# Patient Record
Sex: Female | Born: 1990 | Race: White | Hispanic: No | Smoking: Former smoker
Health system: Southern US, Community
[De-identification: ages and names within clinical notes are randomized; demographics above are authoritative.]

## PROBLEM LIST (undated history)

## (undated) ENCOUNTER — Inpatient Hospital Stay (HOSPITAL_COMMUNITY): Payer: Self-pay

## (undated) DIAGNOSIS — F419 Anxiety disorder, unspecified: Secondary | ICD-10-CM

## (undated) DIAGNOSIS — A749 Chlamydial infection, unspecified: Secondary | ICD-10-CM

## (undated) DIAGNOSIS — Z3490 Encounter for supervision of normal pregnancy, unspecified, unspecified trimester: Secondary | ICD-10-CM

## (undated) DIAGNOSIS — F32A Depression, unspecified: Secondary | ICD-10-CM

## (undated) DIAGNOSIS — O0932 Supervision of pregnancy with insufficient antenatal care, second trimester: Secondary | ICD-10-CM

## (undated) HISTORY — PX: DILATION AND CURETTAGE OF UTERUS: SHX78

## (undated) HISTORY — DX: Supervision of pregnancy with insufficient antenatal care, second trimester: O09.32

## (undated) HISTORY — PX: NO PAST SURGERIES: SHX2092

---
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
## 2009-09-17 ENCOUNTER — Emergency Department (HOSPITAL_COMMUNITY): Admission: EM | Admit: 2009-09-17 | Discharge: 2009-09-17 | Payer: Self-pay | Admitting: Emergency Medicine

## 2009-11-19 ENCOUNTER — Emergency Department (HOSPITAL_COMMUNITY): Admission: EM | Admit: 2009-11-19 | Discharge: 2009-11-19 | Payer: Self-pay | Admitting: Emergency Medicine

## 2009-12-25 ENCOUNTER — Emergency Department (HOSPITAL_COMMUNITY): Admission: EM | Admit: 2009-12-25 | Discharge: 2009-12-26 | Payer: Self-pay | Admitting: Emergency Medicine

## 2011-03-25 ENCOUNTER — Emergency Department (HOSPITAL_COMMUNITY): Payer: No Typology Code available for payment source

## 2011-03-25 ENCOUNTER — Emergency Department (HOSPITAL_COMMUNITY)
Admission: EM | Admit: 2011-03-25 | Discharge: 2011-03-25 | Disposition: A | Discharge status: Home / Self Care | Payer: No Typology Code available for payment source | Attending: Emergency Medicine | Admitting: Emergency Medicine

## 2011-03-25 DIAGNOSIS — R071 Chest pain on breathing: Secondary | ICD-10-CM | POA: Insufficient documentation

## 2011-03-25 DIAGNOSIS — S40029A Contusion of unspecified upper arm, initial encounter: Secondary | ICD-10-CM | POA: Insufficient documentation

## 2011-03-25 DIAGNOSIS — IMO0002 Reserved for concepts with insufficient information to code with codable children: Secondary | ICD-10-CM | POA: Insufficient documentation

## 2011-07-31 ENCOUNTER — Emergency Department (HOSPITAL_COMMUNITY): Payer: Self-pay

## 2011-07-31 ENCOUNTER — Emergency Department (HOSPITAL_COMMUNITY)
Admission: EM | Admit: 2011-07-31 | Discharge: 2011-08-01 | Disposition: A | Discharge status: Home / Self Care | Payer: Self-pay | Attending: Emergency Medicine | Admitting: Emergency Medicine

## 2011-07-31 DIAGNOSIS — R05 Cough: Secondary | ICD-10-CM | POA: Insufficient documentation

## 2011-07-31 DIAGNOSIS — J40 Bronchitis, not specified as acute or chronic: Secondary | ICD-10-CM | POA: Insufficient documentation

## 2011-07-31 DIAGNOSIS — R062 Wheezing: Secondary | ICD-10-CM | POA: Insufficient documentation

## 2011-07-31 DIAGNOSIS — R059 Cough, unspecified: Secondary | ICD-10-CM | POA: Insufficient documentation

## 2011-07-31 LAB — URINALYSIS, ROUTINE W REFLEX MICROSCOPIC
Bilirubin Urine: NEGATIVE
Glucose, UA: NEGATIVE mg/dL
Hgb urine dipstick: NEGATIVE
Ketones, ur: NEGATIVE mg/dL
Leukocytes, UA: NEGATIVE
Nitrite: NEGATIVE
Protein, ur: NEGATIVE mg/dL
Specific Gravity, Urine: 1.026 (ref 1.005–1.030)
Urobilinogen, UA: 1 mg/dL (ref 0.0–1.0)
pH: 7 (ref 5.0–8.0)

## 2011-07-31 LAB — POCT PREGNANCY, URINE: Preg Test, Ur: NEGATIVE

## 2011-08-21 ENCOUNTER — Emergency Department (HOSPITAL_COMMUNITY): Payer: Self-pay

## 2011-08-21 ENCOUNTER — Emergency Department (HOSPITAL_COMMUNITY)
Admission: EM | Admit: 2011-08-21 | Discharge: 2011-08-21 | Disposition: A | Discharge status: Home / Self Care | Payer: Self-pay | Attending: Emergency Medicine | Admitting: Emergency Medicine

## 2011-08-21 DIAGNOSIS — M79609 Pain in unspecified limb: Secondary | ICD-10-CM | POA: Insufficient documentation

## 2011-08-21 DIAGNOSIS — IMO0002 Reserved for concepts with insufficient information to code with codable children: Secondary | ICD-10-CM | POA: Insufficient documentation

## 2011-08-21 DIAGNOSIS — M7989 Other specified soft tissue disorders: Secondary | ICD-10-CM | POA: Insufficient documentation

## 2011-08-21 DIAGNOSIS — Y92009 Unspecified place in unspecified non-institutional (private) residence as the place of occurrence of the external cause: Secondary | ICD-10-CM | POA: Insufficient documentation

## 2011-08-21 DIAGNOSIS — S63639A Sprain of interphalangeal joint of unspecified finger, initial encounter: Secondary | ICD-10-CM | POA: Insufficient documentation

## 2011-08-21 DIAGNOSIS — X500XXA Overexertion from strenuous movement or load, initial encounter: Secondary | ICD-10-CM | POA: Insufficient documentation

## 2011-10-28 NOTE — L&D Delivery Note (Signed)
Delivery Note At 7:36 AM a viable female was delivered via Vaginal, Spontaneous Delivery (Presentation: Right Occiput Anterior).  APGAR: 8, 9; weight 6 lb 10.7 oz (3025 g).   Placenta status: Intact, Spontaneous.  Cord: 3 vessels with the following complications: None.    Anesthesia: Local  Episiotomy: None Lacerations: Labial Suture Repair: 3.0 vicryl rapide Est. Blood Loss (mL): 300  Mom to postpartum.  Baby to stay with Mommy.  BOVARD,Katessa Attridge 08/17/2012, 7:59 AM  A+/Bo/ Contra?/

## 2012-01-01 ENCOUNTER — Inpatient Hospital Stay (HOSPITAL_COMMUNITY): Payer: Medicaid Other

## 2012-01-01 ENCOUNTER — Encounter (HOSPITAL_COMMUNITY): Payer: Self-pay | Admitting: *Deleted

## 2012-01-01 ENCOUNTER — Inpatient Hospital Stay (HOSPITAL_COMMUNITY)
Admission: AD | Admit: 2012-01-01 | Discharge: 2012-01-01 | Disposition: A | Discharge status: Home / Self Care | Payer: Medicaid Other | Source: Ambulatory Visit | Attending: Obstetrics & Gynecology | Admitting: Obstetrics & Gynecology

## 2012-01-01 DIAGNOSIS — O210 Mild hyperemesis gravidarum: Secondary | ICD-10-CM | POA: Insufficient documentation

## 2012-01-01 DIAGNOSIS — R109 Unspecified abdominal pain: Secondary | ICD-10-CM | POA: Insufficient documentation

## 2012-01-01 DIAGNOSIS — O219 Vomiting of pregnancy, unspecified: Secondary | ICD-10-CM

## 2012-01-01 DIAGNOSIS — Z3400 Encounter for supervision of normal first pregnancy, unspecified trimester: Secondary | ICD-10-CM

## 2012-01-01 HISTORY — DX: Chlamydial infection, unspecified: A74.9

## 2012-01-01 LAB — URINALYSIS, ROUTINE W REFLEX MICROSCOPIC
Bilirubin Urine: NEGATIVE
Ketones, ur: 15 mg/dL — AB
Nitrite: NEGATIVE
Urobilinogen, UA: 0.2 mg/dL (ref 0.0–1.0)

## 2012-01-01 LAB — CBC
MCHC: 33.9 g/dL (ref 30.0–36.0)
MCV: 96.8 fL (ref 78.0–100.0)
Platelets: 244 10*3/uL (ref 150–400)
RDW: 12.9 % (ref 11.5–15.5)
WBC: 7.3 10*3/uL (ref 4.0–10.5)

## 2012-01-01 LAB — WET PREP, GENITAL
Clue Cells Wet Prep HPF POC: NONE SEEN
Trich, Wet Prep: NONE SEEN

## 2012-01-01 MED ORDER — PRENATAL RX 60-1 MG PO TABS: 1.0000 | ORAL_TABLET | Freq: Every day | ORAL | Status: DC

## 2012-01-01 MED ORDER — ONDANSETRON 4 MG PO TBDP: 4.0000 mg | ORAL_TABLET | Freq: Four times a day (QID) | ORAL | Status: AC | PRN

## 2012-01-01 NOTE — ED Provider Notes (Signed)
Jordan Fowler y.o.G2P0010 @[redacted]w[redacted]d  Chief Complaint  Patient presents with  . Possible Pregnancy    SUBJECTIVE  HPI: Pt presents to MAU with n/v for 1-2 weeks and abdominal cramping.  Patient's last menstrual period was 11/20/2011.  She denies LOF, vaginal bleeding, vaginal itching/burning, urinary symptoms, fever/chills, dizziness, or h/a.    Past Medical History  Diagnosis Date  . Chlamydia 2008   Past Surgical History  Procedure Date  . No past surgeries    History   Social History  . Marital Status: Single    Spouse Name: N/A    Number of Children: N/A  . Years of Education: N/A   Occupational History  . Not on file.   Social History Main Topics  . Smoking status: Former Smoker    Quit date: 12/21/2011  . Smokeless tobacco: Not on file  . Alcohol Use: No  . Drug Use: No  . Sexually Active: Yes    Birth Control/ Protection: None   Other Topics Concern  . Not on file   Social History Narrative  . No narrative on file   No current facility-administered medications on file prior to encounter.   No current outpatient prescriptions on file prior to encounter.   No Known Allergies  ROS: Pertinent items in HPI  OBJECTIVE Blood pressure 108/50, pulse 78, temperature 98 F (36.7 C), temperature source Oral, resp. rate 16, height 5\' 4"  (1.626 m), weight 64.683 kg (142 lb 9.6 oz), last menstrual period 11/20/2011, SpO2 100.00%. GENERAL: Well-developed, well-nourished female in no acute distress.  LUNGS: Clear to auscultation bilaterally.  HEART: Regular rate and rhythm. ABDOMEN: Soft, nontender EXTREMITIES: Nontender, no edema Pelvic exam: Cervix pink, visually closed, scant clear discharge Bimanual exam: Cervix 0/long/high, soft, anterior, neg CMT, uterus tender upon palpation, slightly enlarged, adnexa without enlargement or mass    RESULTS Results for orders placed during the hospital encounter of 01/01/12 (from the past 24 hour(s))  URINALYSIS, ROUTINE W  REFLEX MICROSCOPIC     Status: Abnormal   Collection Time   01/01/12  2:15 PM      Component Value Range   Color, Urine YELLOW  YELLOW    APPearance HAZY (*) CLEAR    Specific Gravity, Urine 1.025  1.005 - 1.030    pH 6.0  5.0 - 8.0    Glucose, UA NEGATIVE  NEGATIVE (mg/dL)   Hgb urine dipstick NEGATIVE  NEGATIVE    Bilirubin Urine NEGATIVE  NEGATIVE    Ketones, ur 15 (*) NEGATIVE (mg/dL)   Protein, ur NEGATIVE  NEGATIVE (mg/dL)   Urobilinogen, UA 0.2  0.0 - 1.0 (mg/dL)   Nitrite NEGATIVE  NEGATIVE    Leukocytes, UA NEGATIVE  NEGATIVE   PREGNANCY, URINE     Status: Abnormal   Collection Time   01/01/12  2:15 PM      Component Value Range   Preg Test, Ur POSITIVE (*) NEGATIVE   POCT PREGNANCY, URINE     Status: Abnormal   Collection Time   01/01/12  2:22 PM      Component Value Range   Preg Test, Ur POSITIVE (*) NEGATIVE   HCG, QUANTITATIVE, PREGNANCY     Status: Abnormal   Collection Time   01/01/12  3:29 PM      Component Value Range   hCG, Beta Chain, Quant, S 78295 (*) <5 (mIU/mL)  CBC     Status: Abnormal   Collection Time   01/01/12  3:29 PM      Component Value  Range   WBC 7.3  4.0 - 10.5 (K/uL)   RBC 3.41 (*) 3.87 - 5.11 (MIL/uL)   Hemoglobin 11.2 (*) 12.0 - 15.0 (g/dL)   HCT 69.6 (*) 29.5 - 46.0 (%)   MCV 96.8  78.0 - 100.0 (fL)   MCH 32.8  26.0 - 34.0 (pg)   MCHC 33.9  30.0 - 36.0 (g/dL)   RDW 28.4  13.2 - 44.0 (%)   Platelets 244  150 - 400 (K/uL)  WET PREP, GENITAL     Status: Abnormal   Collection Time   01/01/12  5:30 PM      Component Value Range   Yeast Wet Prep HPF POC NONE SEEN  NONE SEEN    Trich, Wet Prep NONE SEEN  NONE SEEN    Clue Cells Wet Prep HPF POC NONE SEEN  NONE SEEN    WBC, Wet Prep HPF POC FEW (*) NONE SEEN     IMAGING   ASSESSMENT IUP [redacted]w[redacted]d by U/S N/V of pregnancy  PLAN D/C home Prenatal vitamins Zofran 4mg  PO Q6 hours PRN nausea List of prenatal providers given F/U with prenatal care by 12 weeks Return to MAU as  needed

## 2012-01-01 NOTE — Progress Notes (Signed)
Patient states she has been having lower back pain for 3 days, has had nausea with some vomiting for 1 1/2 weeks. No bleeding. Has missed her last period.

## 2012-01-01 NOTE — Discharge Instructions (Signed)
Pregnancy - First Trimester  During sexual intercourse, millions of sperm go into the vagina. Only 1 sperm will penetrate and fertilize the female egg while it is in the Fallopian tube. One week later, the fertilized egg implants into the wall of the uterus. An embryo begins to develop into a baby. At 6 to 8 weeks, the eyes and face are formed and the heartbeat can be seen on ultrasound. At the end of 12 weeks (first trimester), all the baby's organs are formed. Now that you are pregnant, you will want to do everything you can to have a healthy baby. Two of the most important things are to get good prenatal care and follow your caregiver's instructions. Prenatal care is all the medical care you receive before the baby's birth. It is given to prevent, find, and treat problems during the pregnancy and childbirth.  PRENATAL EXAMS   During prenatal visits, your weight, blood pressure and urine are checked. This is done to make sure you are healthy and progressing normally during the pregnancy.   A pregnant woman should gain 25 to 35 pounds during the pregnancy. However, if you are over weight or underweight, your caregiver will advise you regarding your weight.   Your caregiver will ask and answer questions for you.   Blood work, cervical cultures, other necessary tests and a Pap test are done during your prenatal exams. These tests are done to check on your health and the probable health of your baby. Tests are strongly recommended and done for HIV with your permission. This is the virus that causes AIDS. These tests are done because medications can be given to help prevent your baby from being born with this infection should you have been infected without knowing it. Blood work is also used to find out your blood type, previous infections and follow your blood levels (hemoglobin).   Low hemoglobin (anemia) is common during pregnancy. Iron and vitamins are given to help prevent this. Later in the pregnancy, blood  tests for diabetes will be done along with any other tests if any problems develop. You may need tests to make sure you and the baby are doing well.   You may need other tests to make sure you and the baby are doing well.  CHANGES DURING THE FIRST TRIMESTER (THE FIRST 3 MONTHS OF PREGNANCY)  Your body goes through many changes during pregnancy. They vary from person to person. Talk to your caregiver about changes you notice and are concerned about. Changes can include:   Your menstrual period stops.   The egg and sperm carry the genes that determine what you look like. Genes from you and your partner are forming a baby. The female genes determine whether the baby is a boy or a girl.   Your body increases in girth and you may feel bloated.   Feeling sick to your stomach (nauseous) and throwing up (vomiting). If the vomiting is uncontrollable, call your caregiver.   Your breasts will begin to enlarge and become tender.   Your nipples may stick out more and become darker.   The need to urinate more. Painful urination may mean you have a bladder infection.   Tiring easily.   Loss of appetite.   Cravings for certain kinds of food.   At first, you may gain or lose a couple of pounds.   You may have changes in your emotions from day to day (excited to be pregnant or concerned something may go wrong with   the pregnancy and baby).   You may have more vivid and strange dreams.  HOME CARE INSTRUCTIONS    It is very important to avoid all smoking, alcohol and un-prescribed drugs during your pregnancy. These affect the formation and growth of the baby. Avoid chemicals while pregnant to ensure the delivery of a healthy infant.   Start your prenatal visits by the 12th week of pregnancy. They are usually scheduled monthly at first, then more often in the last 2 months before delivery. Keep your caregiver's appointments. Follow your caregiver's instructions regarding medication use, blood and lab tests, exercise, and  diet.   During pregnancy, you are providing food for you and your baby. Eat regular, well-balanced meals. Choose foods such as meat, fish, milk and other low fat dairy products, vegetables, fruits, and whole-grain breads and cereals. Your caregiver will tell you of the ideal weight gain.   You can help morning sickness by keeping soda crackers at the bedside. Eat a couple before arising in the morning. You may want to use the crackers without salt on them.   Eating 4 to 5 small meals rather than 3 large meals a day also may help the nausea and vomiting.   Drinking liquids between meals instead of during meals also seems to help nausea and vomiting.   A physical sexual relationship may be continued throughout pregnancy if there are no other problems. Problems may be early (premature) leaking of amniotic fluid from the membranes, vaginal bleeding, or belly (abdominal) pain.   Exercise regularly if there are no restrictions. Check with your caregiver or physical therapist if you are unsure of the safety of some of your exercises. Greater weight gain will occur in the last 2 trimesters of pregnancy. Exercising will help:   Control your weight.   Keep you in shape.   Prepare you for labor and delivery.   Help you lose your pregnancy weight after you deliver your baby.   Wear a good support or jogging bra for breast tenderness during pregnancy. This may help if worn during sleep too.   Ask when prenatal classes are available. Begin classes when they are offered.   Do not use hot tubs, steam rooms or saunas.   Wear your seat belt when driving. This protects you and your baby if you are in an accident.   Avoid raw meat, uncooked cheese, cat litter boxes and soil used by cats throughout the pregnancy. These carry germs that can cause birth defects in the baby.   The first trimester is a good time to visit your dentist for your dental health. Getting your teeth cleaned is OK. Use a softer toothbrush and brush  gently during pregnancy.   Ask for help if you have financial, counseling or nutritional needs during pregnancy. Your caregiver will be able to offer counseling for these needs as well as refer you for other special needs.   Do not take any medications or herbs unless told by your caregiver.   Inform your caregiver if there is any mental or physical domestic violence.   Make a list of emergency phone numbers of family, friends, hospital, and police and fire departments.   Write down your questions. Take them to your prenatal visit.   Do not douche.   Do not cross your legs.   If you have to stand for long periods of time, rotate you feet or take small steps in a circle.   You may have more vaginal secretions that may   require a sanitary pad. Do not use tampons or scented sanitary pads.  MEDICATIONS AND DRUG USE IN PREGNANCY   Take prenatal vitamins as directed. The vitamin should contain 1 milligram of folic acid. Keep all vitamins out of reach of children. Only a couple vitamins or tablets containing iron may be fatal to a baby or young child when ingested.   Avoid use of all medications, including herbs, over-the-counter medications, not prescribed or suggested by your caregiver. Only take over-the-counter or prescription medicines for pain, discomfort, or fever as directed by your caregiver. Do not use aspirin, ibuprofen, or naproxen unless directed by your caregiver.   Let your caregiver also know about herbs you may be using.   Alcohol is related to a number of birth defects. This includes fetal alcohol syndrome. All alcohol, in any form, should be avoided completely. Smoking will cause low birth rate and premature babies.   Street or illegal drugs are very harmful to the baby. They are absolutely forbidden. A baby born to an addicted mother will be addicted at birth. The baby will go through the same withdrawal an adult does.   Let your caregiver know about any medications that you have to take  and for what reason you take them.  MISCARRIAGE IS COMMON DURING PREGNANCY  A miscarriage does not mean you did something wrong. It is not a reason to worry about getting pregnant again. Your caregiver will help you with questions you may have. If you have a miscarriage, you may need minor surgery.  SEEK MEDICAL CARE IF:   You have any concerns or worries during your pregnancy. It is better to call with your questions if you feel they cannot wait, rather than worry about them.  SEEK IMMEDIATE MEDICAL CARE IF:    An unexplained oral temperature above 102 F (38.9 C) develops, or as your caregiver suggests.   You have leaking of fluid from the vagina (birth canal). If leaking membranes are suspected, take your temperature and inform your caregiver of this when you call.   There is vaginal spotting or bleeding. Notify your caregiver of the amount and how many pads are used.   You develop a bad smelling vaginal discharge with a change in the color.   You continue to feel sick to your stomach (nauseated) and have no relief from remedies suggested. You vomit blood or coffee ground-like materials.   You lose more than 2 pounds of weight in 1 week.   You gain more than 2 pounds of weight in 1 week and you notice swelling of your face, hands, feet, or legs.   You gain 5 pounds or more in 1 week (even if you do not have swelling of your hands, face, legs, or feet).   You get exposed to German measles and have never had them.   You are exposed to fifth disease or chickenpox.   You develop belly (abdominal) pain. Round ligament discomfort is a common non-cancerous (benign) cause of abdominal pain in pregnancy. Your caregiver still must evaluate this.   You develop headache, fever, diarrhea, pain with urination, or shortness of breath.   You fall or are in a car accident or have any kind of trauma.   There is mental or physical violence in your home.  Document Released: 10/07/2001 Document Revised: 10/02/2011  Document Reviewed: 04/10/2009  [employer] Patient Information 2012 [employer].

## 2012-01-02 LAB — GC/CHLAMYDIA PROBE AMP, GENITAL: Chlamydia, DNA Probe: NEGATIVE

## 2012-04-29 ENCOUNTER — Encounter (HOSPITAL_COMMUNITY): Payer: Self-pay | Admitting: *Deleted

## 2012-04-29 ENCOUNTER — Inpatient Hospital Stay (HOSPITAL_COMMUNITY)
Admission: AD | Admit: 2012-04-29 | Discharge: 2012-04-29 | Disposition: A | Discharge status: Home / Self Care | Payer: Medicaid Other | Source: Ambulatory Visit | Attending: Obstetrics and Gynecology | Admitting: Obstetrics and Gynecology

## 2012-04-29 DIAGNOSIS — R109 Unspecified abdominal pain: Secondary | ICD-10-CM | POA: Insufficient documentation

## 2012-04-29 DIAGNOSIS — N390 Urinary tract infection, site not specified: Secondary | ICD-10-CM | POA: Insufficient documentation

## 2012-04-29 DIAGNOSIS — O2340 Unspecified infection of urinary tract in pregnancy, unspecified trimester: Secondary | ICD-10-CM

## 2012-04-29 DIAGNOSIS — O2390 Unspecified genitourinary tract infection in pregnancy, unspecified trimester: Secondary | ICD-10-CM | POA: Insufficient documentation

## 2012-04-29 LAB — URINALYSIS, ROUTINE W REFLEX MICROSCOPIC
Bilirubin Urine: NEGATIVE
Nitrite: POSITIVE — AB
Protein, ur: 30 mg/dL — AB
Specific Gravity, Urine: 1.025 (ref 1.005–1.030)
Urobilinogen, UA: 0.2 mg/dL (ref 0.0–1.0)

## 2012-04-29 LAB — URINE MICROSCOPIC-ADD ON

## 2012-04-29 MED ORDER — PHENAZOPYRIDINE HCL 200 MG PO TABS: 200.0000 mg | ORAL_TABLET | Freq: Three times a day (TID) | ORAL | Status: AC

## 2012-04-29 MED ORDER — PHENAZOPYRIDINE HCL 100 MG PO TABS
200.0000 mg | ORAL_TABLET | Freq: Once | ORAL | Status: AC
  Administered 2012-04-29: 200 mg via ORAL
  Filled 2012-04-29: qty 2

## 2012-04-29 MED ORDER — NITROFURANTOIN MONOHYD MACRO 100 MG PO CAPS
100.0000 mg | ORAL_CAPSULE | Freq: Once | ORAL | Status: AC
  Administered 2012-04-29: 100 mg via ORAL
  Filled 2012-04-29: qty 1

## 2012-04-29 MED ORDER — NITROFURANTOIN MONOHYD MACRO 100 MG PO CAPS: 100.0000 mg | ORAL_CAPSULE | Freq: Two times a day (BID) | ORAL | Status: AC

## 2012-04-29 NOTE — MAU Provider Note (Signed)
History     CSN: [id number]  Arrival date and time: 04/29/12 0343   First Provider Initiated Contact with Patient 04/29/12 [id number]      Chief Complaint  Patient presents with  . Abdominal Pain   HPI Jordan Fowler 21 y.o. [redacted]w[redacted]d  Comes to MAU with lower abdominal pain and difficulty urinating since 8 pm on 04-28-12.  Having urgency and frequency and urinating in very small amounts.  Having some back tenderness on the right side.  Vomited x one this evening.  Has not had vomiting during this pregnancy.  Next appointment in the office is on Friday.  OB History    Grav Para Term Preterm Abortions TAB SAB Ect Mult Living   2 0   1  1   0      Past Medical History  Diagnosis Date  . Chlamydia 2008    Past Surgical History  Procedure Date  . No past surgeries     History reviewed. No pertinent family history.  History  Substance Use Topics  . Smoking status: Former Smoker    Quit date: 12/21/2011  . Smokeless tobacco: Not on file  . Alcohol Use: No    Allergies: No Known Allergies  Prescriptions prior to admission  Medication Sig Dispense Refill  . Prenatal Vit-Fe Fumarate-FA (PRENATAL MULTIVITAMIN) 60-1 MG tablet Take 1 tablet by mouth daily.  30 tablet  5  . acetaminophen (TYLENOL) 500 MG tablet Take 500 mg by mouth every 6 (six) hours as needed. pain      . albuterol (PROVENTIL HFA;VENTOLIN HFA) 108 (90 BASE) MCG/ACT inhaler Inhale 2 puffs into the lungs every 6 (six) hours as needed. SOB        Review of Systems  Constitutional: Negative for fever.  Gastrointestinal: Positive for abdominal pain. Negative for nausea and vomiting.  Genitourinary: Positive for dysuria, urgency and frequency.   Physical Exam   Blood pressure 121/67, pulse 89, temperature 99.1 F (37.3 C), temperature source Oral, resp. rate 20, height 5' 4.25" (1.632 m), weight 152 lb (68.947 kg), last menstrual period 11/20/2011.  Physical Exam  Nursing note and vitals reviewed. Constitutional: She  is oriented to person, place, and time. She appears well-developed and well-nourished.  HENT:  Head: Normocephalic.  Eyes: EOM are normal.  Neck: Neck supple.  GI: Soft. There is tenderness. There is no rebound and no guarding.       RLQ pain with palpation  No contractions palpated.  No contractions on monitor strip.  FHT baseline 150.  Genitourinary:       No CVA tenderness  Musculoskeletal: Normal range of motion.  Neurological: She is alert and oriented to person, place, and time.  Skin: Skin is warm and dry.  Psychiatric: She has a normal mood and affect.    MAU Course  Procedures Results for orders placed during the hospital encounter of 04/29/12 (from the past 24 hour(s))  URINALYSIS, ROUTINE W REFLEX MICROSCOPIC     Status: Abnormal   Collection Time   04/29/12  3:45 AM      Component Value Range   Color, Urine YELLOW  YELLOW   APPearance CLEAR  CLEAR   Specific Gravity, Urine 1.025  1.005 - 1.030   pH 6.0  5.0 - 8.0   Glucose, UA NEGATIVE  NEGATIVE mg/dL   Hgb urine dipstick SMALL (*) NEGATIVE   Bilirubin Urine NEGATIVE  NEGATIVE   Ketones, ur NEGATIVE  NEGATIVE mg/dL   Protein, ur 30 (*) NEGATIVE mg/dL  Urobilinogen, UA 0.2  0.0 - 1.0 mg/dL   Nitrite POSITIVE (*) NEGATIVE   Leukocytes, UA MODERATE (*) NEGATIVE  URINE MICROSCOPIC-ADD ON     Status: Normal   Collection Time   04/29/12  3:45 AM      Component Value Range   Squamous Epithelial / LPF RARE  RARE   WBC, UA 0-2  <3 WBC/hpf   RBC / HPF 0-2  <3 RBC/hpf   MDM Urine culture pending.  Symptoms classic for UTI.  Given pyridium and Macrobid in MAU.  Beginning to feel better within 20 minutes.  Assessment and Plan  UTI in pregnancy  Plan Will prescribe macrobid 100 mg po bid x 7 days and pyridium 200 mg po tid x 2 days. Keep your appointment in the office on Friday.   Get your prescriptions filled on the way home. Drink at least 8 8-oz glasses of water every day.   Tieler Cournoyer 04/29/2012, 4:18 AM

## 2012-04-29 NOTE — MAU Note (Signed)
Right sided abdominal pain. No vaginal bleeding or discharge. Having trouble urinating.

## 2012-04-30 ENCOUNTER — Other Ambulatory Visit (HOSPITAL_COMMUNITY): Payer: Self-pay | Admitting: Obstetrics and Gynecology

## 2012-04-30 ENCOUNTER — Other Ambulatory Visit (HOSPITAL_COMMUNITY): Payer: Self-pay | Admitting: *Deleted

## 2012-04-30 DIAGNOSIS — Z3689 Encounter for other specified antenatal screening: Secondary | ICD-10-CM

## 2012-05-01 LAB — URINE CULTURE

## 2012-05-04 ENCOUNTER — Ambulatory Visit (HOSPITAL_COMMUNITY)
Admission: RE | Admit: 2012-05-04 | Discharge: 2012-05-04 | Disposition: A | Discharge status: Home / Self Care | Payer: Medicaid Other | Source: Ambulatory Visit | Attending: Obstetrics and Gynecology | Admitting: Obstetrics and Gynecology

## 2012-05-04 DIAGNOSIS — Z3689 Encounter for other specified antenatal screening: Secondary | ICD-10-CM

## 2012-05-04 DIAGNOSIS — O09299 Supervision of pregnancy with other poor reproductive or obstetric history, unspecified trimester: Secondary | ICD-10-CM | POA: Insufficient documentation

## 2012-05-04 DIAGNOSIS — O358XX0 Maternal care for other (suspected) fetal abnormality and damage, not applicable or unspecified: Secondary | ICD-10-CM | POA: Insufficient documentation

## 2012-05-04 DIAGNOSIS — Z1389 Encounter for screening for other disorder: Secondary | ICD-10-CM | POA: Insufficient documentation

## 2012-05-04 DIAGNOSIS — Z363 Encounter for antenatal screening for malformations: Secondary | ICD-10-CM | POA: Insufficient documentation

## 2012-07-26 LAB — OB RESULTS CONSOLE GBS: GBS: NEGATIVE

## 2012-08-16 ENCOUNTER — Encounter (HOSPITAL_COMMUNITY): Payer: Self-pay | Admitting: *Deleted

## 2012-08-16 ENCOUNTER — Inpatient Hospital Stay (HOSPITAL_COMMUNITY): Payer: Medicaid Other

## 2012-08-16 ENCOUNTER — Inpatient Hospital Stay (HOSPITAL_COMMUNITY)
Admission: AD | Admit: 2012-08-16 | Discharge: 2012-08-19 | DRG: 775 | Disposition: A | Discharge status: Home / Self Care | Payer: Medicaid Other | Source: Ambulatory Visit | Attending: Obstetrics and Gynecology | Admitting: Obstetrics and Gynecology

## 2012-08-16 DIAGNOSIS — D649 Anemia, unspecified: Secondary | ICD-10-CM | POA: Diagnosis not present

## 2012-08-16 DIAGNOSIS — O9903 Anemia complicating the puerperium: Secondary | ICD-10-CM | POA: Diagnosis not present

## 2012-08-16 DIAGNOSIS — Z3490 Encounter for supervision of normal pregnancy, unspecified, unspecified trimester: Secondary | ICD-10-CM

## 2012-08-16 HISTORY — DX: Encounter for supervision of normal pregnancy, unspecified, unspecified trimester: Z34.90

## 2012-08-16 LAB — OB RESULTS CONSOLE ANTIBODY SCREEN: Antibody Screen: NEGATIVE

## 2012-08-16 LAB — OB RESULTS CONSOLE ABO/RH: RH Type: POSITIVE

## 2012-08-16 LAB — OB RESULTS CONSOLE HIV ANTIBODY (ROUTINE TESTING): HIV: NONREACTIVE

## 2012-08-16 MED ORDER — LACTATED RINGERS IV BOLUS (SEPSIS)
500.0000 mL | Freq: Once | INTRAVENOUS | Status: DC
Start: 2012-08-16 — End: 2012-08-17

## 2012-08-17 ENCOUNTER — Encounter (HOSPITAL_COMMUNITY): Payer: Self-pay | Admitting: *Deleted

## 2012-08-17 DIAGNOSIS — Z3490 Encounter for supervision of normal pregnancy, unspecified, unspecified trimester: Secondary | ICD-10-CM

## 2012-08-17 HISTORY — DX: Encounter for supervision of normal pregnancy, unspecified, unspecified trimester: Z34.90

## 2012-08-17 LAB — CBC
HCT: 31 % — ABNORMAL LOW (ref 36.0–46.0)
Hemoglobin: 10.3 g/dL — ABNORMAL LOW (ref 12.0–15.0)
MCH: 31.3 pg (ref 26.0–34.0)
MCHC: 33.2 g/dL (ref 30.0–36.0)
MCV: 94.2 fL (ref 78.0–100.0)
RBC: 3.29 MIL/uL — ABNORMAL LOW (ref 3.87–5.11)

## 2012-08-17 LAB — RPR: RPR Ser Ql: NONREACTIVE

## 2012-08-17 MED ORDER — PHENYLEPHRINE 40 MCG/ML (10ML) SYRINGE FOR IV PUSH (FOR BLOOD PRESSURE SUPPORT): 80.0000 ug | PREFILLED_SYRINGE | INTRAVENOUS | Status: DC | PRN

## 2012-08-17 MED ORDER — LIDOCAINE HCL (PF) 1 % IJ SOLN
30.0000 mL | INTRAMUSCULAR | Status: DC | PRN
  Administered 2012-08-17: 30 mL via SUBCUTANEOUS
  Filled 2012-08-17: qty 30

## 2012-08-17 MED ORDER — ALBUTEROL SULFATE HFA 108 (90 BASE) MCG/ACT IN AERS
2.0000 | INHALATION_SPRAY | Freq: Four times a day (QID) | RESPIRATORY_TRACT | Status: DC | PRN
  Filled 2012-08-17: qty 6.7

## 2012-08-17 MED ORDER — WITCH HAZEL-GLYCERIN EX PADS: 1.0000 "application " | MEDICATED_PAD | CUTANEOUS | Status: DC | PRN

## 2012-08-17 MED ORDER — ZOLPIDEM TARTRATE 5 MG PO TABS: 5.0000 mg | ORAL_TABLET | Freq: Every evening | ORAL | Status: DC | PRN

## 2012-08-17 MED ORDER — EPHEDRINE 5 MG/ML INJ: 10.0000 mg | INTRAVENOUS | Status: DC | PRN

## 2012-08-17 MED ORDER — TETANUS-DIPHTH-ACELL PERTUSSIS 5-2.5-18.5 LF-MCG/0.5 IM SUSP: 0.5000 mL | Freq: Once | INTRAMUSCULAR | Status: DC

## 2012-08-17 MED ORDER — OXYTOCIN 40 UNITS IN LACTATED RINGERS INFUSION - SIMPLE MED
1.0000 m[IU]/min | INTRAVENOUS | Status: DC
  Filled 2012-08-17: qty 1000

## 2012-08-17 MED ORDER — IBUPROFEN 600 MG PO TABS
600.0000 mg | ORAL_TABLET | Freq: Four times a day (QID) | ORAL | Status: DC
  Administered 2012-08-17 – 2012-08-19 (×7): 600 mg via ORAL
  Filled 2012-08-17 (×8): qty 1

## 2012-08-17 MED ORDER — DIPHENHYDRAMINE HCL 50 MG/ML IJ SOLN: 12.5000 mg | INTRAMUSCULAR | Status: DC | PRN

## 2012-08-17 MED ORDER — ACETAMINOPHEN 325 MG PO TABS: 650.0000 mg | ORAL_TABLET | ORAL | Status: DC | PRN

## 2012-08-17 MED ORDER — DIPHENHYDRAMINE HCL 25 MG PO CAPS: 25.0000 mg | ORAL_CAPSULE | Freq: Four times a day (QID) | ORAL | Status: DC | PRN

## 2012-08-17 MED ORDER — OXYTOCIN BOLUS FROM INFUSION
500.0000 mL | INTRAVENOUS | Status: DC
  Filled 2012-08-17 (×29): qty 500

## 2012-08-17 MED ORDER — LACTATED RINGERS IV SOLN: 500.0000 mL | Freq: Once | INTRAVENOUS | Status: DC

## 2012-08-17 MED ORDER — LACTATED RINGERS IV SOLN: INTRAVENOUS | Status: DC

## 2012-08-17 MED ORDER — OXYTOCIN 40 UNITS IN LACTATED RINGERS INFUSION - SIMPLE MED: 62.5000 mL/h | INTRAVENOUS | Status: DC

## 2012-08-17 MED ORDER — ONDANSETRON HCL 4 MG PO TABS: 4.0000 mg | ORAL_TABLET | ORAL | Status: DC | PRN

## 2012-08-17 MED ORDER — OXYCODONE-ACETAMINOPHEN 5-325 MG PO TABS: 1.0000 | ORAL_TABLET | ORAL | Status: DC | PRN

## 2012-08-17 MED ORDER — DIBUCAINE 1 % RE OINT: 1.0000 "application " | TOPICAL_OINTMENT | RECTAL | Status: DC | PRN

## 2012-08-17 MED ORDER — IBUPROFEN 600 MG PO TABS: 600.0000 mg | ORAL_TABLET | Freq: Four times a day (QID) | ORAL | Status: DC | PRN

## 2012-08-17 MED ORDER — TERBUTALINE SULFATE 1 MG/ML IJ SOLN: 0.2500 mg | Freq: Once | INTRAMUSCULAR | Status: DC | PRN

## 2012-08-17 MED ORDER — BENZOCAINE-MENTHOL 20-0.5 % EX AERO
1.0000 "application " | INHALATION_SPRAY | CUTANEOUS | Status: DC | PRN
  Filled 2012-08-17: qty 56

## 2012-08-17 MED ORDER — SENNOSIDES-DOCUSATE SODIUM 8.6-50 MG PO TABS
2.0000 | ORAL_TABLET | Freq: Every day | ORAL | Status: DC
  Administered 2012-08-17: 2 via ORAL

## 2012-08-17 MED ORDER — SIMETHICONE 80 MG PO CHEW: 80.0000 mg | CHEWABLE_TABLET | ORAL | Status: DC | PRN

## 2012-08-17 MED ORDER — LANOLIN HYDROUS EX OINT: TOPICAL_OINTMENT | CUTANEOUS | Status: DC | PRN

## 2012-08-17 MED ORDER — PRENATAL MULTIVITAMIN CH
1.0000 | ORAL_TABLET | Freq: Every day | ORAL | Status: DC
  Administered 2012-08-19: 1 via ORAL
  Filled 2012-08-17 (×3): qty 1

## 2012-08-17 MED ORDER — CITRIC ACID-SODIUM CITRATE 334-500 MG/5ML PO SOLN: 30.0000 mL | ORAL | Status: DC | PRN

## 2012-08-17 MED ORDER — LACTATED RINGERS IV SOLN
500.0000 mL | INTRAVENOUS | Status: DC | PRN
  Administered 2012-08-17: 500 mL via INTRAVENOUS

## 2012-08-17 MED ORDER — FENTANYL 2.5 MCG/ML BUPIVACAINE 1/10 % EPIDURAL INFUSION (WH - ANES): 14.0000 mL/h | INTRAMUSCULAR | Status: DC

## 2012-08-17 MED ORDER — OXYTOCIN 40 UNITS IN LACTATED RINGERS INFUSION - SIMPLE MED
1.0000 m[IU]/min | INTRAVENOUS | Status: DC
  Administered 2012-08-17: 2 m[IU]/min via INTRAVENOUS

## 2012-08-17 MED ORDER — LACTATED RINGERS IV SOLN
INTRAVENOUS | Status: DC
  Administered 2012-08-17 (×2): via INTRAVENOUS

## 2012-08-17 MED ORDER — ONDANSETRON HCL 4 MG/2ML IJ SOLN: 4.0000 mg | INTRAMUSCULAR | Status: DC | PRN

## 2012-08-17 MED ORDER — BUTORPHANOL TARTRATE 1 MG/ML IJ SOLN
2.0000 mg | INTRAMUSCULAR | Status: DC | PRN
  Administered 2012-08-17 (×3): 2 mg via INTRAVENOUS
  Filled 2012-08-17 (×5): qty 2

## 2012-08-17 MED ORDER — ONDANSETRON HCL 4 MG/2ML IJ SOLN: 4.0000 mg | Freq: Four times a day (QID) | INTRAMUSCULAR | Status: DC | PRN

## 2012-08-17 MED ORDER — OXYCODONE-ACETAMINOPHEN 5-325 MG PO TABS
1.0000 | ORAL_TABLET | ORAL | Status: DC | PRN
  Administered 2012-08-18 – 2012-08-19 (×4): 1 via ORAL
  Filled 2012-08-17 (×4): qty 1

## 2012-08-17 NOTE — Progress Notes (Signed)
Patient ID: Jordan Fowler, female   DOB: 02/06/1991, 21 y.o.   MRN: [id number] IUPC placed w/o difficulty/complication SVE 3.4/90/0  Again d/w pt pain control

## 2012-08-17 NOTE — Progress Notes (Signed)
Patient ID: Jordan Fowler, female   DOB: 12-Sep-1991, 21 y.o.   MRN: [id number] AROM for moderate meconium D/w pt SVE 2.3/70/-2  Pitocin to augment, will place IUPC
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
## 2012-08-18 LAB — CBC
MCH: 31 pg (ref 26.0–34.0)
MCHC: 32.6 g/dL (ref 30.0–36.0)
MCV: 94.8 fL (ref 78.0–100.0)
Platelets: 254 10*3/uL (ref 150–400)
RDW: 13.4 % (ref 11.5–15.5)

## 2012-08-18 NOTE — Progress Notes (Signed)
Ur chart review completed.  

## 2012-08-18 NOTE — Progress Notes (Signed)
Post Partum Day 1 Subjective: no complaints, up ad lib, voiding, tolerating PO and nl lochia, pain controlled  Objective: Blood pressure 99/60, pulse 81, temperature 97.9 F (36.6 C), temperature source Oral, resp. rate 18, height 5\' 4"  (1.626 m), weight 77.565 kg (171 lb), last menstrual period 11/20/2011, SpO2 99.00%, unknown if currently breastfeeding.  Physical Exam:  General: alert and no distress Lochia: appropriate Uterine Fundus: firm   Basename 08/18/12 0610 08/17/12 0035  HGB 7.8* 10.3*  HCT 23.9* 31.0*    Assessment/Plan: Plan for discharge tomorrow.  Tol blood loss anemia well   LOS: 2 days   BOVARD,Kately Graffam 08/18/2012, 10:56 AM
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
## 2012-08-19 MED ORDER — PRENATAL MULTIVITAMIN CH: 1.0000 | ORAL_TABLET | Freq: Every day | ORAL | Status: DC

## 2012-08-19 MED ORDER — IBUPROFEN 800 MG PO TABS: 800.0000 mg | ORAL_TABLET | Freq: Three times a day (TID) | ORAL | Status: DC | PRN

## 2012-08-19 MED ORDER — OXYCODONE-ACETAMINOPHEN 5-325 MG PO TABS: 1.0000 | ORAL_TABLET | Freq: Four times a day (QID) | ORAL | Status: DC | PRN

## 2012-08-19 NOTE — Progress Notes (Signed)
Post Partum Day 2 Subjective: no complaints, up ad lib, voiding, tolerating PO and nl lochia, pain controlled  Objective: Blood pressure 92/73, pulse 84, temperature 98.1 F (36.7 C), temperature source Oral, resp. rate 18, height 5\' 4"  (1.626 m), weight 77.565 kg (171 lb), last menstrual period 11/20/2011, SpO2 99.00%, unknown if currently breastfeeding.  Physical Exam:  General: alert and no distress Lochia: appropriate Uterine Fundus: firm   Basename 08/18/12 0610 08/17/12 0035  HGB 7.8* 10.3*  HCT 23.9* 31.0*    Assessment/Plan: Discharge home, with rx for motrin/percocet/pnv.  F/u 6 weeks   LOS: 3 days   Fowler,Jordan Banales 08/19/2012, 8:46 AM

## 2012-08-19 NOTE — Discharge Summary (Signed)
Obstetric Discharge Summary Reason for Admission: induction of labor, secondary to 4/8 BPP Prenatal Procedures: none Intrapartum Procedures: spontaneous vaginal delivery Postpartum Procedures: none Complications-Operative and Postpartum: B labial laceration Hemoglobin  Date Value Range Status  08/18/2012 7.8* 12.0 - 15.0 g/dL Final     DELTA CHECK NOTED     REPEATED TO VERIFY     HCT  Date Value Range Status  08/18/2012 23.9* 36.0 - 46.0 % Final    Physical Exam:  General: alert and no distress Lochia: appropriate Uterine Fundus: firm  Discharge Diagnoses: Term Pregnancy-delivered  Discharge Information: Date: 08/19/2012 Activity: pelvic rest Diet: routine Medications: PNV, Ibuprofen and Percocet Condition: stable Instructions: refer to practice specific booklet Discharge to: home Follow-up Information    Follow up with BOVARD,Zurii Hewes, MD. Schedule an appointment as soon as possible for a visit in 6 weeks.   Contact information:   [address] [place] [address] [phone]          Newborn Data: Live born female  Birth Weight: 6 lb 10.7 oz (3025 g) APGAR: 8, 9  Home with mother.  BOVARD,Sahib Pella 08/19/2012, 8:55 AM

## 2012-08-25 ENCOUNTER — Inpatient Hospital Stay (HOSPITAL_COMMUNITY): Admission: RE | Admit: 2012-08-25 | Payer: No Typology Code available for payment source | Source: Ambulatory Visit

## 2013-03-06 IMAGING — CR DG CHEST 2V
2 series · 2 of 2 positions shown · non-contrast
Comparison: 03/25/2011

CLINICAL DATA: Short of breath, wheezing, congestion, cough.  Pain.
Smoker.

CHEST - 2 VIEW

[w chest pa]
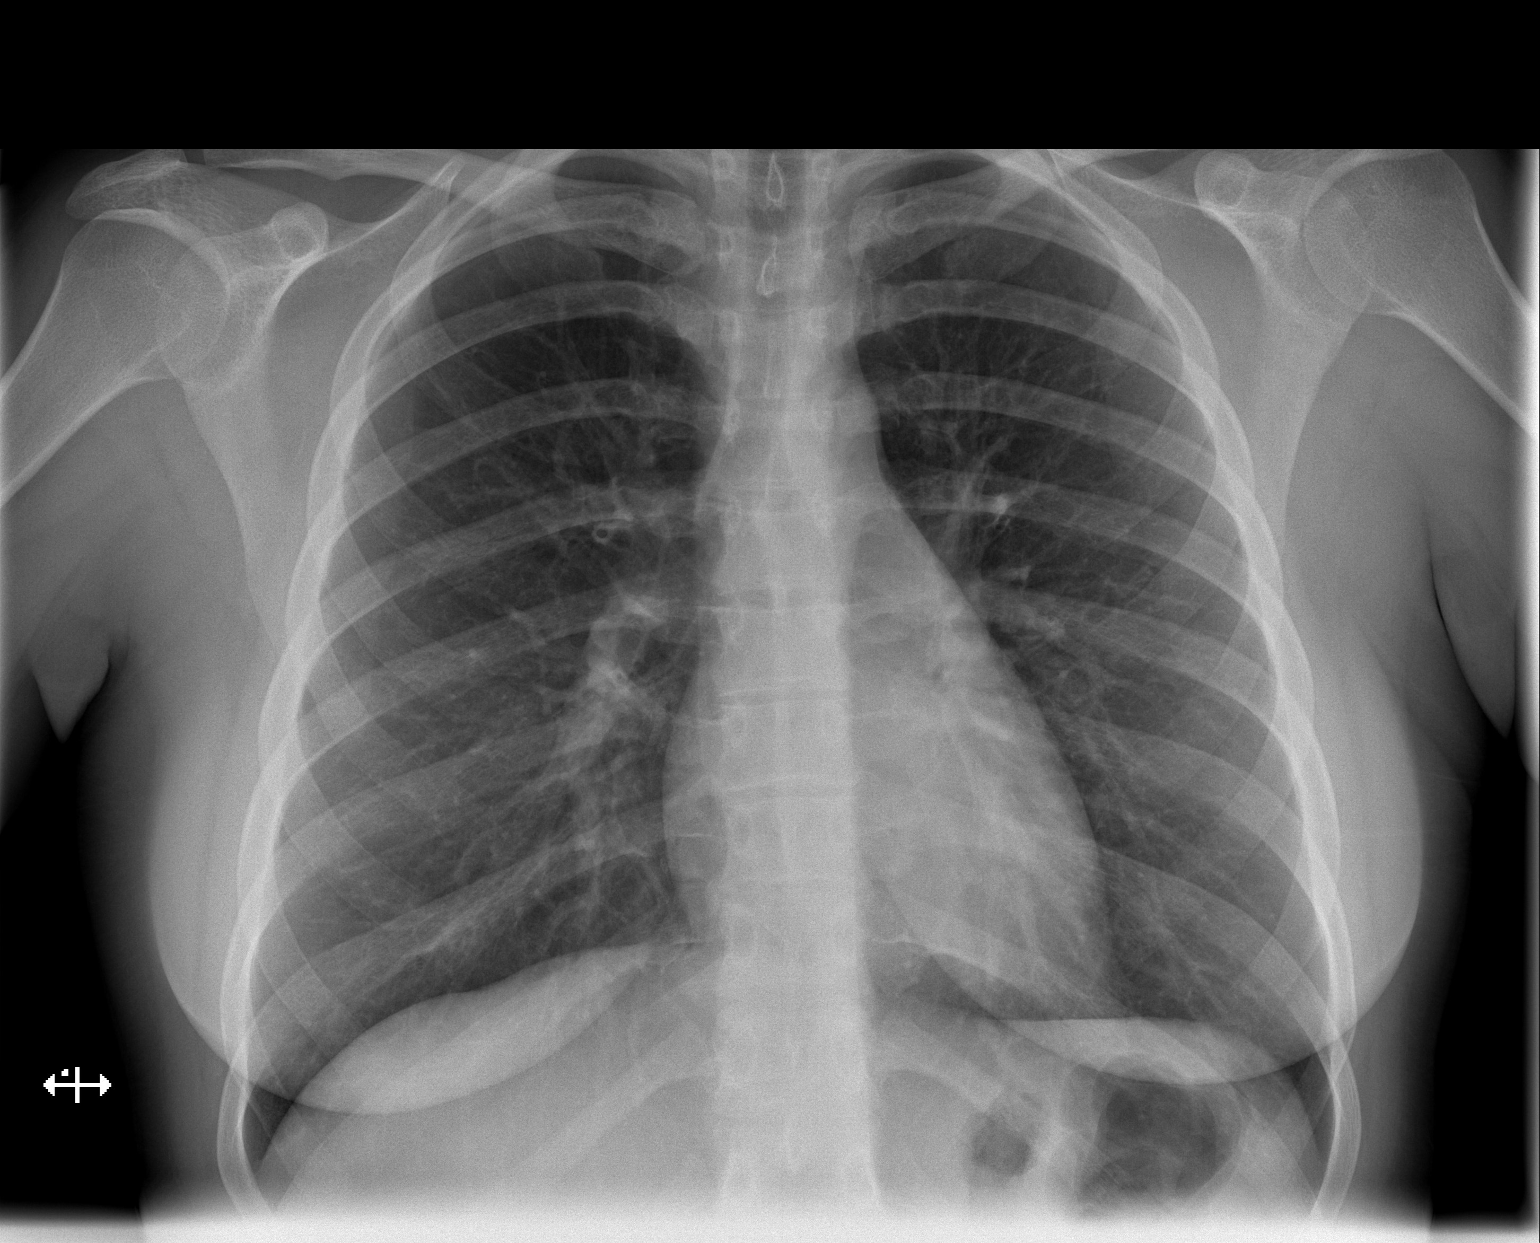

[w chest lat]
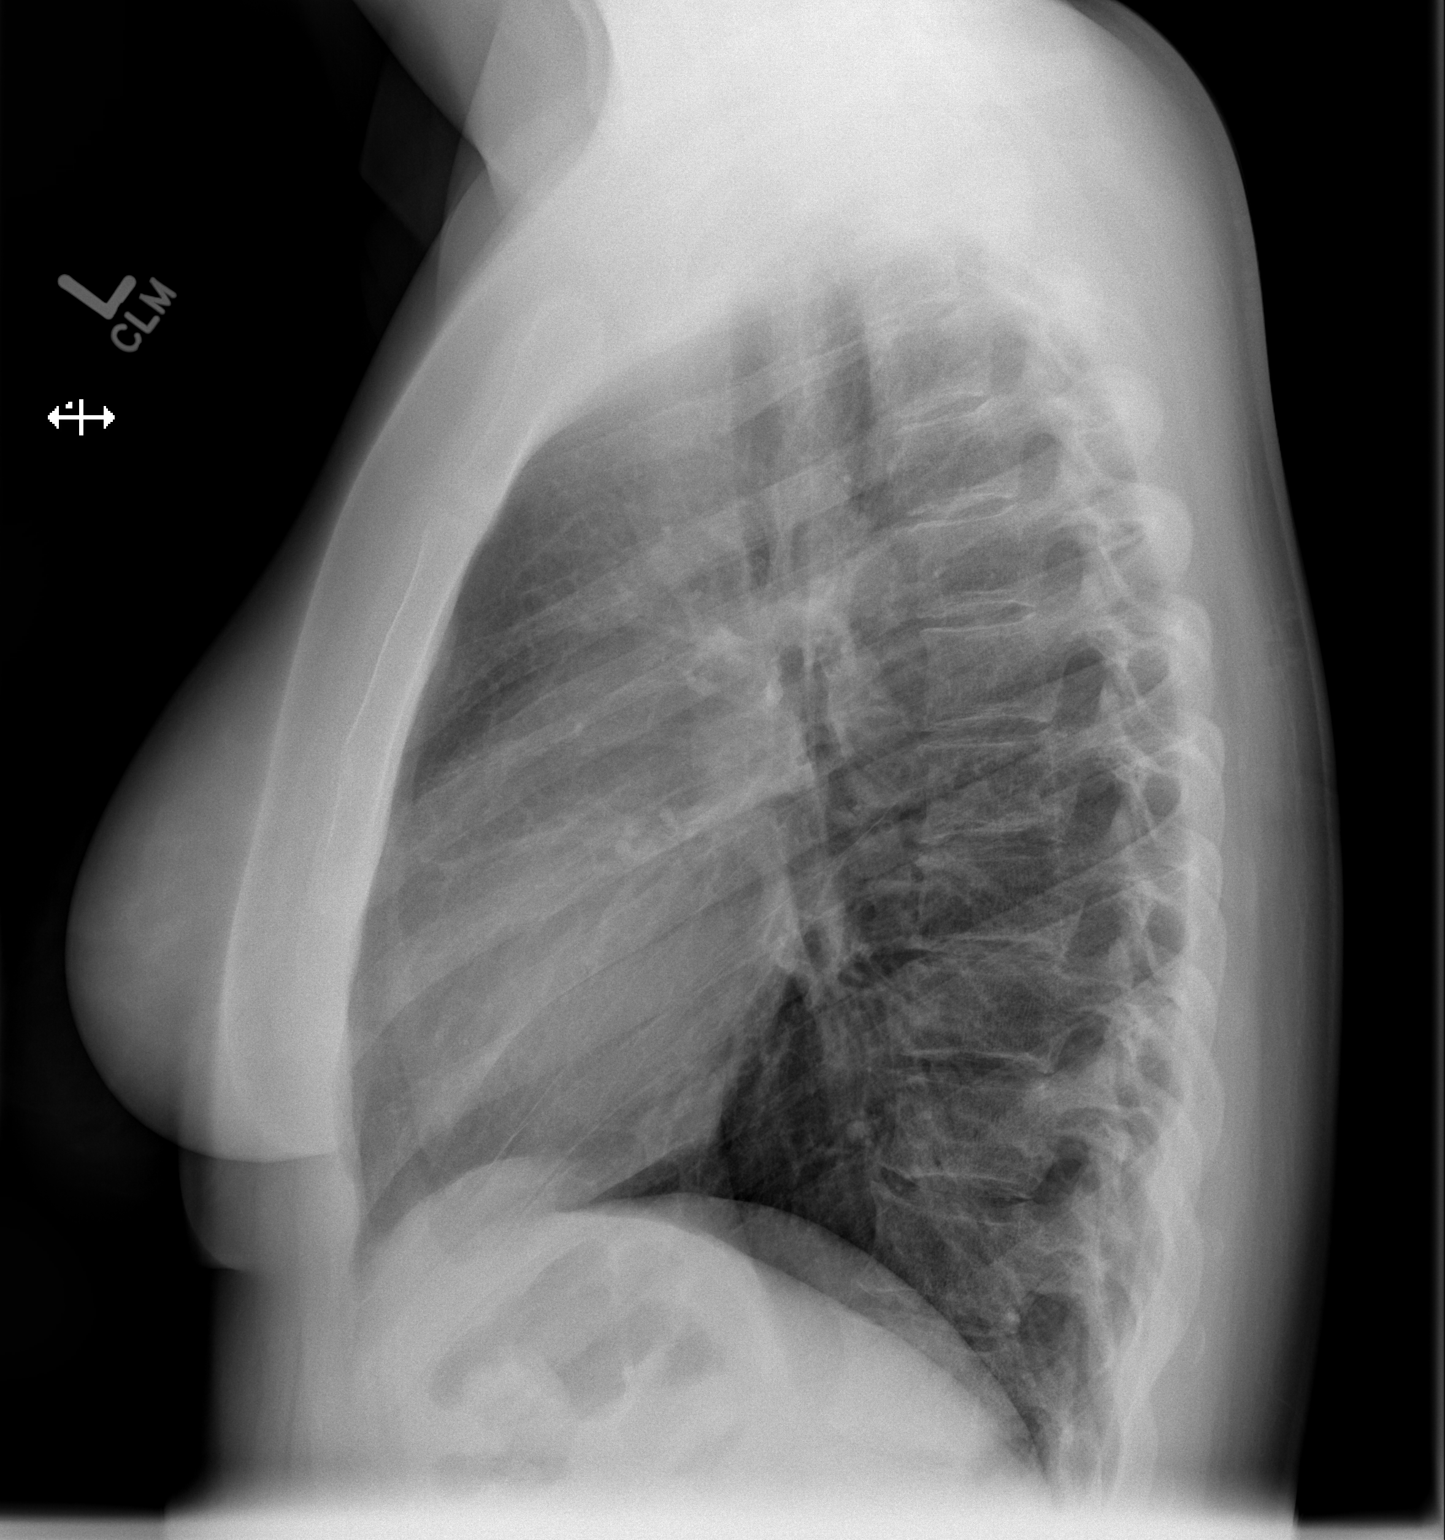

[2 of 2 positions shown; findings below may reference images not displayed]

FINDINGS: The heart size and pulmonary vascularity are normal. The
lungs appear clear and expanded without focal air space disease or
consolidation. No blunting of the costophrenic angles.  No
significant change since previous study.
IMPRESSION: No evidence of active pulmonary disease.

## 2013-09-26 ENCOUNTER — Encounter (HOSPITAL_COMMUNITY): Payer: Self-pay | Admitting: Emergency Medicine

## 2013-09-26 ENCOUNTER — Emergency Department (HOSPITAL_COMMUNITY)
Admission: EM | Admit: 2013-09-26 | Discharge: 2013-09-26 | Disposition: A | Discharge status: Home / Self Care | Payer: Medicaid Other | Attending: Emergency Medicine | Admitting: Emergency Medicine

## 2013-09-26 ENCOUNTER — Emergency Department (HOSPITAL_COMMUNITY): Payer: Medicaid Other

## 2013-09-26 DIAGNOSIS — Y939 Activity, unspecified: Secondary | ICD-10-CM | POA: Insufficient documentation

## 2013-09-26 DIAGNOSIS — T148XXA Other injury of unspecified body region, initial encounter: Secondary | ICD-10-CM

## 2013-09-26 DIAGNOSIS — Z8619 Personal history of other infectious and parasitic diseases: Secondary | ICD-10-CM | POA: Insufficient documentation

## 2013-09-26 DIAGNOSIS — X58XXXA Exposure to other specified factors, initial encounter: Secondary | ICD-10-CM | POA: Insufficient documentation

## 2013-09-26 DIAGNOSIS — S60229A Contusion of unspecified hand, initial encounter: Secondary | ICD-10-CM | POA: Insufficient documentation

## 2013-09-26 DIAGNOSIS — F17200 Nicotine dependence, unspecified, uncomplicated: Secondary | ICD-10-CM | POA: Insufficient documentation

## 2013-09-26 DIAGNOSIS — Y929 Unspecified place or not applicable: Secondary | ICD-10-CM | POA: Insufficient documentation

## 2013-09-26 MED ORDER — ALBUTEROL SULFATE HFA 108 (90 BASE) MCG/ACT IN AERS
2.0000 | INHALATION_SPRAY | Freq: Once | RESPIRATORY_TRACT | Status: AC
  Administered 2013-09-26: 2 via RESPIRATORY_TRACT
  Filled 2013-09-26: qty 6.7

## 2013-09-26 NOTE — ED Notes (Signed)
PT REPORTS SHE IS UNABLE TO COMPLETELY GRIP WITH LEFT HAND. BRUISING NOTED TO KNUCKLES. PT DENIES INJURY. ALSO DENIES PAIN.

## 2013-09-26 NOTE — ED Provider Notes (Signed)
CSN: [id number]     Arrival date & time 09/26/13  4098 History  This chart was scribed for Jordan Elders, NP, working with [name], MD, by Ardelia Mems ED Scribe. This patient was seen in room TR09C/TR09C and the patient's care was started at 9:55 AM.    Chief Complaint  Patient presents with  . Hand Injury    The history is provided by the patient. No language interpreter was used.    HPI Comments: Jordan Fowler is a 22 y.o. 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 She states that she does not remember any specific details on how the injury occurred. She states that over the past 2 weeks, she has had bruising to her left hand and gradually improving swelling to her hand. She states that she has had reduced ROM in her hand, especially of her 5th finger, since the injury. She denies numbness or paresthesias. She denies any other pain or symptoms.   Past Medical History  Diagnosis Date  . Chlamydia 2008  . Normal pregnancy 08/17/2012  . SVD (spontaneous vaginal delivery) 08/17/2012   Past Surgical History  Procedure Laterality Date  . No past surgeries     No family history on file. History  Substance Use Topics  . Smoking status: Current Every Day Smoker    Last Attempt to Quit: 12/21/2011  . Smokeless tobacco: Never Used  . Alcohol Use: No   OB History   Grav Para Term Preterm Abortions TAB SAB Ect Mult Living   2 1 1  1  1   1      Review of Systems  Musculoskeletal:       Left hand bruising and reduced ROM.  Neurological: Negative for numbness.       Denies paresthesias.  All other systems reviewed and are negative.   Allergies  Review of patient's allergies indicates no known allergies.  Home Medications   Current Outpatient Rx  Name  Route  Sig  Dispense  Refill  . Pseudoephedrine-DM-GG (ROBITUSSIN COLD & COUGH PO)   Oral   Take 1 tablet by mouth  once.         Marland Kitchen albuterol (PROVENTIL HFA;VENTOLIN HFA) 108 (90 BASE) MCG/ACT inhaler   Inhalation   Inhale 2 puffs into the lungs every 6 (six) hours as needed. For shortness of breath          Triage Vitals: BP 104/60  Pulse 58  Temp(Src) 97.7 F (36.5 C) (Oral)  Resp 18  SpO2 96%  LMP 09/09/2013  Physical Exam  Nursing note and vitals reviewed. Constitutional: She is oriented to person, place, and time. She appears well-developed and well-nourished. No distress.  HENT:  Head: Normocephalic and atraumatic.  Eyes: EOM are normal.  Neck: Neck supple. No tracheal deviation present.  Cardiovascular: Normal rate and intact distal pulses.   Good pulses.  Pulmonary/Chest: Effort normal. No respiratory distress.  Musculoskeletal: Normal range of motion.  Good grip and strength of left hand.  Neurological: She is alert and oriented to person, place, and time.  Normal sensation. Neurovascularly intact.  Skin: Skin is warm and dry.  Good cap refill.  Psychiatric: She has a normal mood and affect. Her behavior is normal.    ED Course  Procedures (including critical care time)  DIAGNOSTIC STUDIES: Oxygen Saturation is 96% on RA, normal by my interpretation.    COORDINATION OF  CARE: 10:00 AM- Discussed normal radiology findings. Discussed plan for pt to receive an ace wrap. Advised pt of plan for at home care with ice, Motrin and elevation. Pt advised of plan for treatment and pt agrees.  Labs Review Labs Reviewed - No data to display Imaging Review Dg Hand Complete Left  09/26/2013   CLINICAL DATA:  Pain post trauma  EXAM: LEFT HAND - COMPLETE 3+ VIEW  COMPARISON:  None.  FINDINGS: Frontal, oblique, and lateral views were obtained. There is no demonstrable fracture or dislocation. Joint spaces appear intact. No erosive change.  IMPRESSION: No abnormality noted.   Electronically Signed   By: Bretta Bang M.D.   On: 09/26/2013 09:31    EKG Interpretation   None        MDM   1. Contusion    Mild bruising noted on dorsum of hand near 3rd and 4th MCP joints. Healing contusion, no edema noted. Good ROM of hand and wrist. Although 5th digit a little difficult to fully flex. No numbness or tingling with good sensation. Brisk capillary refill. Negative x-ray. New ace wrap given for support. Discussed exercises to strengthen 5th digit and grip.    I personally performed the services described in this documentation, which was scribed in my presence. The recorded information has been reviewed and is accurate.     Jordan Elders, NP 09/27/13 1120
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
## 2013-09-27 NOTE — ED Provider Notes (Signed)
Medical screening examination/treatment/procedure(s) were performed by non-physician practitioner and as supervising physician I was immediately available for consultation/collaboration.  EKG Interpretation   None         Charles B. Sheldon, MD 09/27/13 1342 

## 2014-08-28 ENCOUNTER — Encounter (HOSPITAL_COMMUNITY): Payer: Self-pay | Admitting: Emergency Medicine

## 2014-10-23 ENCOUNTER — Emergency Department (HOSPITAL_COMMUNITY)
Admission: EM | Admit: 2014-10-23 | Discharge: 2014-10-23 | Disposition: A | Discharge status: Home / Self Care | Payer: No Typology Code available for payment source | Attending: Emergency Medicine | Admitting: Emergency Medicine

## 2014-10-23 ENCOUNTER — Encounter (HOSPITAL_COMMUNITY): Payer: Self-pay | Admitting: Emergency Medicine

## 2014-10-23 DIAGNOSIS — Z79899 Other long term (current) drug therapy: Secondary | ICD-10-CM | POA: Insufficient documentation

## 2014-10-23 DIAGNOSIS — Z720 Tobacco use: Secondary | ICD-10-CM | POA: Insufficient documentation

## 2014-10-23 DIAGNOSIS — K088 Other specified disorders of teeth and supporting structures: Secondary | ICD-10-CM | POA: Insufficient documentation

## 2014-10-23 DIAGNOSIS — Z8619 Personal history of other infectious and parasitic diseases: Secondary | ICD-10-CM | POA: Insufficient documentation

## 2014-10-23 DIAGNOSIS — K029 Dental caries, unspecified: Secondary | ICD-10-CM | POA: Insufficient documentation

## 2014-10-23 DIAGNOSIS — K0889 Other specified disorders of teeth and supporting structures: Secondary | ICD-10-CM

## 2014-10-23 MED ORDER — HYDROCODONE-ACETAMINOPHEN 5-325 MG PO TABS: 1.0000 | ORAL_TABLET | ORAL | Status: DC | PRN

## 2014-10-23 MED ORDER — PENICILLIN V POTASSIUM 500 MG PO TABS: 500.0000 mg | ORAL_TABLET | Freq: Four times a day (QID) | ORAL | Status: AC

## 2014-10-23 NOTE — ED Notes (Signed)
Pt c/o right lower dental pain x 3 days. 

## 2014-10-23 NOTE — Discharge Instructions (Signed)
Take the prescribed medication as directed. °Follow-up with dentist. °Return to the ED for new or worsening symptoms. ° ° °Emergency Department Resource Guide °1) Find a Doctor and Pay Out of Pocket °Although you won't have to find out who is covered by your insurance plan, it is a good idea to ask around and get recommendations. You will then need to call the office and see if the doctor you have chosen will accept you as a new patient and what types of options they offer for patients who are self-pay. Some doctors offer discounts or will set up payment plans for their patients who do not have insurance, but you will need to ask so you aren't surprised when you get to your appointment. ° °2) Contact Your Local Health Department °Not all health departments have doctors that can see patients for sick visits, but many do, so it is worth a call to see if yours does. If you don't know where your local health department is, you can check in your phone book. The CDC also has a tool to help you locate your state's health department, and many state websites also have listings of all of their local health departments. ° °3) Find a Walk-in Clinic °If your illness is not likely to be very severe or complicated, you may want to try a walk in clinic. These are popping up all over the country in pharmacies, drugstores, and shopping centers. They're usually staffed by nurse practitioners or physician assistants that have been trained to treat common illnesses and complaints. They're usually fairly quick and inexpensive. However, if you have serious medical issues or chronic medical problems, these are probably not your best option. ° °No Primary Care Doctor: °- Call Health Connect at  832-8000 - they can help you locate a primary care doctor that  accepts your insurance, provides certain services, etc. °- Physician Referral Service- 1-800-533-3463 ° °Chronic Pain Problems: °Organization         Address  Phone   Notes  °[facility]  ([phone]) [phone] Patients need to be referred by their primary care doctor.  ° °Medication Assistance: °Organization         Address  Phone   Notes  °[place] [facility] [address] °[place],  [address] (336) 641-8030 --Must be a resident of [place] °-- Must have NO insurance coverage whatsoever (no Medicaid/ Medicare, etc.) °-- The pt. MUST have a primary care doctor that directs their care regularly and follows them in the community °  °[facility]ist  (866) 331-1348   °United Way  (888) 892-1162   ° °Agencies that provide inexpensive medical care: °Organization         Address  Phone   Notes  °[facility]  (336) [phone]   °[facility]    (336) 832-7272   °[facility] [address] °[place],  [address] (336) 832-4777   °[facility] [address], °[place] (336) 271-4999   °[facility]    (336) 373-0678   °[facility]    (336) 272-1050   °[facility] ° [address], [place] Phone:  (336) 832-4444, Fax:  (336) 832-4440 Hours of Operation:  9 am - 6 pm, M-F.  Also accepts Medicaid/Medicare and self-pay.  °[facility] ° [address], [place] Phone: (336) [phone], Fax: (336) [phone]. Hours   of Operation:  8:30 am - 5:30 pm, M-F.  Also accepts Medicaid and self-pay.  [facility] [place] [address], [place] Phone: [phone]   [place], [place], [place] ([phone], Ext. [phone] Mondays & Thursdays: 7-9 AM.  First 15 patients are seen on a first come, first serve basis.    Ewa Gentry Providers:  Organization         Address  Phone   Notes  [facility] [address], [place] [phone] Also accepts self-pay patients.  [facility] [address], [place]  ([phone]   [place], [address], [place] [phone]   [facility] [address], [place] [phone]   Lucianne Lei [address], [place]   ([phone] Only accepts [place] Access [place] patients after they have their name applied to their card.   Self-Pay (no insurance) in [facility]:  Organization         Address  Phone   Notes  Sickle Cell Patients, [facility] [place] [phone]   [facility] [place] [phone]   [facility] [place]  [place], [place], [place] [phone]   [facility]/Dr. Osei-Bonsu  [address], [place] or [address], [place] [phone] Phone number for both [place] and [place] locations is the same.  [facility] [address] [place] [phone]   [facility] [address] [place] or [address] [phone]   [facility] [address], [place] (732)[phone], phone; [phone], fax Sees patients 1st and 3rd Saturday of every month.  Must not qualify for public or private insurance (i.e. Medicaid, Medicare, [place] Health Choice, Veterans' Benefits)  Household income should be no more than 200% of the poverty level The clinic cannot treat you if you are pregnant or think you are pregnant  Sexually transmitted diseases are not treated at the clinic.    Dental Care: Organization         Address  Phone  Notes  [facility] Department of [facility] [phone] Accepts children up to age 25 who are enrolled in [place] or [place]; pregnant women with a Medicaid card; and children who have applied for Medicaid  or [place] Health Choice, but were declined, whose parents can pay a reduced fee at time of service.  [facility] [place] [facility] Department of [facility]  [address], [place] [phone] Accepts children up to age 71 who are enrolled in [place] or [place]; pregnant women with a Medicaid card; and children who have applied for Medicaid or [place] Health Choice, but were declined, whose parents can pay a reduced fee at time of service.  [place] [facility]  [place] [phone] Patients are seen by appointment only. Walk-ins are not accepted. [place] will see patients 52 years of age and older. Monday - Tuesday (8am-5pm) Most Wednesdays (8:30-5pm) $30 per visit, cash only  [facility] Adult [facility] Access PRO[facility]  [address], [place] ([phone] Patients are  seen by appointment only. Walk-ins are not accepted. [place] will see patients 30 years of age and older. One Wednesday Evening (Monthly: Volunteer Based).  $30 per visit, cash only  [place]  [phone] for adults; Children under age 61, call Graduate Pediatric Dentistry at [phone]. Children aged 81-14, please call [phone] to request a pediatric application.  Dental services are provided in all areas of dental care including fillings, crowns and bridges, complete and partial dentures, implants, gum treatment, root canals, and extractions. Preventive care is also provided. Treatment is provided to both adults and children. Patients are selected via a lottery and there is often a waiting list.   [facility] [address] [place]  (934)[phone] [email or web address]   [facility] [address] [place], [place] ([phone], Ext. [phone] Second and Fourth Thursday of each month, opens at 6:30 AM; Clinic ends at 9 AM.  Patients are seen on a first-come first-served basis, and a limited number are seen  during each clinic.   [facility]  [address] Hillard Danker [place], [place] [phone]   Eligibility Requirements You must have lived in [place], [place], or [place] counties for at least the last three months.   You cannot be eligible for state or federal sponsored [employer], including [employer], [place], or [employer].   You generally cannot be eligible for healthcare insurance through your employer.    How to apply: Eligibility screenings are held every Tuesday and Wednesday afternoon from 1:00 pm until 4:00 pm. You do not need an appointment for the interview!  [facility] [address] [place], [place]   [place]  [place] Department  [place]  [phone]    Behavioral Health Resources in the Community: Intensive Outpatient Programs Organization         Address  Phone  Notes  [place]. [address], [place], [place] [phone]   [facility] [address], [place], [place]   [facility] [address] [place], [place]   [place] [address]  [place], [place] or [phone]   Substance Abuse Resources Organization         Address  Phone  Notes  [facility] Drug Services  586-249-4233   [place]  [phone]   [place]   [place]a Pester  (660)678-6058   Residential & Outpatient Substance Abuse Program  (365)835-0330   Psychological Services Organization         Address  Phone  Notes  [facility]  [place]  [phone]   [place] [address] [place] or [phone]    Mobile Crisis Teams Organization         Address  Phone  Notes  [facility],  Mobile Crisis Care Unit  (740) [phone]   [facility]  [address] [place], [place]   [place] [address], [place] [phone]    Self-Help/Support Groups Organization         Address  Phone             Notes  [place]. of  - variety of support groups  [place] Call for  more information  °Narcotics Anonymous (NA), [facility] [address], °[place]  2 meetings at this location  ° °Residential Treatment Programs °Organization         Address  Phone  Notes  °[facility] [address],    °[place]  1-866-801-8205   °[facility] ° [address], [place], [place] [phone]   °[facility] [address], [place] [phone] Admissions: 8am-3pm M-F  °[facility] [address]    °[place], [place] [phone]   °[facility] [address], [place], [place] [phone]   °[facility] [address]  °[place], [place] [phone]   °[facility] - Intensive Outpatient [address], [place], [place] [phone]   °[facility] [address]  °[place], [place] 1-877-615-2722 or 336-784-9470   °[facility] ([facility]) [address] [place], [place] [phone] Accepts Medicaid  °[facility] [address]  °[place] 1-800-659-3381 Substance Abuse/Addiction Treatment  ° °[place] Behavioral Health Resources °Organization         Address  Phone  Notes  °[facility]  (888) 581-9988   °Julie Brannon, PhD [address] [place], [place]   (336) 349-5553 or (336) 951-0000   ° Behavioral   [address] °[place], [place] (336) 349-4454   °[facility] [address], [place], [place] (336) 342-8316 Insurance/Medicaid/sponsorship through [facility]  °[facility] [address]                                    [place], [place] (336) [phone] Therapy/tele-psych/case  °[facility] [address]  ° [place], [place] (336) [phone]    °Dr. Arfeen  (336) [phone]   °[facility] [place]  [employer] [place] [facility] 1) [address],  °2) [address], [place] °3)  [address], [place] (336) [phone] °(336) 342-7768 ° °(336) 342-8140   °[place] Child Abuse Hotline (336) 342-1394 or (336) 342-3537 (After Hours)    ° ° ° °

## 2014-10-23 NOTE — ED Provider Notes (Signed)
CSN: [id number]     Arrival date & time 10/23/14  1304 History   First MD Initiated Contact with Patient 10/23/14 1616     Chief Complaint  Patient presents with  . Dental Pain     (Consider location/radiation/quality/duration/timing/severity/associated sxs/prior Treatment) Patient is a 23 y.o. female presenting with tooth pain. The history is provided by the patient and medical records.  Dental Pain   This is a 23 year old female with no significant past medical history presenting to the ED for right lower dental pain for the past 3 days. Patient reports she thinks her wisdom teeth are trying to come through. She also notes some swelling of her right lower gums. She denies any fever or chills. No facial swelling. No difficulty swallowing, but is painful to chew on affected side.    Past Medical History  Diagnosis Date  . Chlamydia 2008  . Normal pregnancy 08/17/2012  . SVD (spontaneous vaginal delivery) 08/17/2012   Past Surgical History  Procedure Laterality Date  . No past surgeries     History reviewed. No pertinent family history. History  Substance Use Topics  . Smoking status: Current Every Day Smoker    Last Attempt to Quit: 12/21/2011  . Smokeless tobacco: Never Used  . Alcohol Use: No   OB History    Gravida Para Term Preterm AB TAB SAB Ectopic Multiple Living   2 1 1  1  1   1      Review of Systems  HENT: Positive for dental problem.   All other systems reviewed and are negative.     Allergies  Review of patient's allergies indicates no known allergies.  Home Medications   Prior to Admission medications   Medication Sig Start Date End Date Taking? Authorizing Provider  albuterol (PROVENTIL HFA;VENTOLIN HFA) 108 (90 BASE) MCG/ACT inhaler Inhale 2 puffs into the lungs every 6 (six) hours as needed. For shortness of breath    Historical Provider, MD  HYDROcodone-acetaminophen (NORCO/VICODIN) 5-325 MG per tablet Take 1 tablet by mouth every 4 (four) hours  as needed. 10/23/14   Garlon HatchetLisa M Sanders, PA-C  penicillin v potassium (VEETID) 500 MG tablet Take 1 tablet (500 mg total) by mouth 4 (four) times daily. 10/23/14 10/30/14  Garlon HatchetLisa M Sanders, PA-C  Pseudoephedrine-DM-GG (ROBITUSSIN COLD & COUGH PO) Take 1 tablet by mouth once.    Historical Provider, MD   BP 106/55 mmHg  Pulse 75  Temp(Src) 97.8 F (36.6 C) (Oral)  Resp 18  SpO2 100%   Physical Exam  Constitutional: She is oriented to person, place, and time. She appears well-developed and well-nourished. No distress.  HENT:  Head: Normocephalic and atraumatic.  Mouth/Throat: Uvula is midline, oropharynx is clear and moist and mucous membranes are normal. Abnormal dentition. Dental caries present. No dental abscesses. No oropharyngeal exudate, posterior oropharyngeal edema, posterior oropharyngeal erythema or tonsillar abscesses.    Teeth largely in poor dentition, right lower molar tender to palpation, surrounding gingiva severely swollen and nearly covering molar, no appreciable fluid collection or abscess; handling secretions appropriately, no trismus  Eyes: Conjunctivae and EOM are normal. Pupils are equal, round, and reactive to light.  Neck: Normal range of motion. Neck supple.  Cardiovascular: Normal rate, regular rhythm and normal heart sounds.   Pulmonary/Chest: Effort normal and breath sounds normal. No respiratory distress. She has no wheezes.  Musculoskeletal: Normal range of motion.  Neurological: She is alert and oriented to person, place, and time.  Skin: Skin is warm and dry. She  is not diaphoretic.  Psychiatric: She has a normal mood and affect.  Nursing note and vitals reviewed.   ED Course  Procedures (including critical care time) Labs Review Labs Reviewed - No data to display  Imaging Review No results found.   EKG Interpretation None      MDM   Final diagnoses:  Pain, dental   23 y.o. F with dental pain, likely from her right lower wisdom tooth.  She does  have surrounding gingival swelling nearly covering her right molar.  No facial swelling, airway patent.  Patient will be started on antibiotics and pain meds.  Encouraged to FU with dentist, referrals and resource guide provided.  Discussed plan with patient, he/she acknowledged understanding and agreed with plan of care.  Return precautions given for new or worsening symptoms.  Garlon HatchetLisa M Sanders, PA-C 10/23/14 1642  Gilda Creasehristopher J. Pollina, MD 10/23/14 [phone]

## 2015-03-07 ENCOUNTER — Inpatient Hospital Stay (HOSPITAL_COMMUNITY)
Admission: AD | Admit: 2015-03-07 | Discharge: 2015-03-07 | Disposition: A | Discharge status: Home / Self Care | Payer: Self-pay | Source: Ambulatory Visit | Attending: Obstetrics & Gynecology | Admitting: Obstetrics & Gynecology

## 2015-03-07 ENCOUNTER — Encounter (HOSPITAL_COMMUNITY): Payer: Self-pay | Admitting: *Deleted

## 2015-03-07 DIAGNOSIS — F17210 Nicotine dependence, cigarettes, uncomplicated: Secondary | ICD-10-CM | POA: Insufficient documentation

## 2015-03-07 DIAGNOSIS — R112 Nausea with vomiting, unspecified: Secondary | ICD-10-CM

## 2015-03-07 DIAGNOSIS — Z3201 Encounter for pregnancy test, result positive: Secondary | ICD-10-CM | POA: Insufficient documentation

## 2015-03-07 LAB — URINALYSIS, ROUTINE W REFLEX MICROSCOPIC
BILIRUBIN URINE: NEGATIVE
GLUCOSE, UA: NEGATIVE mg/dL
Hgb urine dipstick: NEGATIVE
Ketones, ur: NEGATIVE mg/dL
Leukocytes, UA: NEGATIVE
Nitrite: NEGATIVE
PROTEIN: NEGATIVE mg/dL
SPECIFIC GRAVITY, URINE: 1.015 (ref 1.005–1.030)
UROBILINOGEN UA: 0.2 mg/dL (ref 0.0–1.0)
pH: 7 (ref 5.0–8.0)

## 2015-03-07 LAB — POCT PREGNANCY, URINE: PREG TEST UR: POSITIVE — AB

## 2015-03-07 MED ORDER — METOCLOPRAMIDE HCL 5 MG/ML IJ SOLN
10.0000 mg | Freq: Once | INTRAMUSCULAR | Status: AC
  Administered 2015-03-07: 10 mg via INTRAVENOUS
  Filled 2015-03-07: qty 2

## 2015-03-07 MED ORDER — DEXTROSE 5 % IN LACTATED RINGERS IV BOLUS
1000.0000 mL | Freq: Once | INTRAVENOUS | Status: AC
  Administered 2015-03-07: 1000 mL via INTRAVENOUS

## 2015-03-07 MED ORDER — PROMETHAZINE HCL 12.5 MG PO TABS: 12.5000 mg | ORAL_TABLET | Freq: Four times a day (QID) | ORAL | Status: DC | PRN

## 2015-03-07 MED ORDER — METOCLOPRAMIDE HCL 10 MG PO TABS: 10.0000 mg | ORAL_TABLET | Freq: Three times a day (TID) | ORAL | Status: DC

## 2015-03-07 MED ORDER — FAMOTIDINE IN NACL 20-0.9 MG/50ML-% IV SOLN
20.0000 mg | Freq: Once | INTRAVENOUS | Status: AC
  Administered 2015-03-07: 20 mg via INTRAVENOUS
  Filled 2015-03-07: qty 50

## 2015-03-07 NOTE — Discharge Instructions (Signed)

## 2015-03-07 NOTE — MAU Note (Addendum)
+  HPT April 24. Has been throwing up day and night.  Was told with her first preg, that shouldn't/doesn't happen.  Has lost 10#, is afraid something is wrong with the preg.  Has been having loose stools the past 3 wks, started as watery, now just soft.

## 2015-03-07 NOTE — MAU Provider Note (Signed)
History     CSN: [id number]  Arrival date and time: 03/07/15 1333   First Provider Initiated Contact with Patient 03/07/15 1426      No chief complaint on file.  HPI   [name] is a 24 y.o. female G3P1011 who presents with N/V that started in April. She had a positive pregnancy test at home. She is vomiting everyday, all day long. She is unable to keep anything down at all. She has not had anything to eat in over 24 hours. She is not able to keep down fluids.  She has not tried anything over the counter for the N/V.   OB History    Gravida Para Term Preterm AB TAB SAB Ectopic Multiple Living   3 1 1  1  1   1       Past Medical History  Diagnosis Date  . Chlamydia 2008  . Normal pregnancy 08/17/2012  . SVD (spontaneous vaginal delivery) 08/17/2012    Past Surgical History  Procedure Laterality Date  . No past surgeries      History reviewed. No pertinent family history.  History  Substance Use Topics  . Smoking status: Current Every Day Smoker    Last Attempt to Quit: 12/21/2011  . Smokeless tobacco: Never Used  . Alcohol Use: No    Allergies: No Known Allergies  Prescriptions prior to admission  Medication Sig Dispense Refill Last Dose  . acetaminophen (TYLENOL) 500 MG tablet Take 500 mg by mouth every 6 (six) hours as needed for mild pain or headache.   03/06/2015 at Unknown time  . albuterol (PROVENTIL HFA;VENTOLIN HFA) 108 (90 BASE) MCG/ACT inhaler Inhale 2 puffs into the lungs every 6 (six) hours as needed. For shortness of breath   rescue  . HYDROcodone-acetaminophen (NORCO/VICODIN) 5-325 MG per tablet Take 1 tablet by mouth every 4 (four) hours as needed. (Patient not taking: Reported on 03/07/2015) 12 tablet 0    Results for orders placed or performed during the hospital encounter of 03/07/15 (from the past 48 hour(s))  Urinalysis, Routine w reflex microscopic     Status: Abnormal   Collection Time: 03/07/15  1:40 PM  Result Value Ref Range   Color, Urine YELLOW YELLOW   APPearance HAZY (A) CLEAR   Specific Gravity, Urine 1.015 1.005 - 1.030   pH 7.0 5.0 - 8.0   Glucose, UA NEGATIVE NEGATIVE mg/dL   Hgb urine dipstick NEGATIVE NEGATIVE   Bilirubin Urine NEGATIVE NEGATIVE   Ketones, ur NEGATIVE NEGATIVE mg/dL   Protein, ur NEGATIVE NEGATIVE mg/dL   Urobilinogen, UA 0.2 0.0 - 1.0 mg/dL   Nitrite NEGATIVE NEGATIVE   Leukocytes, UA NEGATIVE NEGATIVE    Comment: MICROSCOPIC NOT DONE ON URINES WITH NEGATIVE PROTEIN, BLOOD, LEUKOCYTES, NITRITE, OR GLUCOSE <1000 mg/dL.  Pregnancy, urine POC     Status: Abnormal   Collection Time: 03/07/15  1:48 PM  Result Value Ref Range   Preg Test, Ur POSITIVE (A) NEGATIVE    Comment:        THE SENSITIVITY OF THIS METHODOLOGY IS >24 mIU/mL     Review of Systems  Constitutional: Negative for fever and chills.  Gastrointestinal: Negative for nausea, vomiting and abdominal pain.  Genitourinary:       Denies vaginal bleeding   Neurological: Positive for dizziness.   Physical Exam   Blood pressure 122/76, pulse 98, temperature 98.1 F (36.7 C), temperature source Oral, resp. rate 18, height 5\' 3"  (1.6 m), weight 70.761 kg (156 lb), last  menstrual period 01/07/2015.  Physical Exam  Constitutional: She is oriented to person, place, and time. She appears well-developed and well-nourished.  Non-toxic appearance. She has a sickly appearance. She does not appear ill. No distress.  HENT:  Head: Normocephalic.  Eyes: Pupils are equal, round, and reactive to light.  Neck: Neck supple.  Respiratory: Effort normal.  Musculoskeletal: Normal range of motion.  Neurological: She is alert and oriented to person, place, and time.  Skin: Skin is warm. She is not diaphoretic.  Psychiatric: Her behavior is normal.    MAU Course  Procedures  None  MDM D5LR bolus Reglan IV Pepcid IV  Patient tolerating PO fluids   Assessment and Plan    A:  1. Non-intractable vomiting with nausea, vomiting  of unspecified type      P:  Discharge home in stable condition Start prenatal care.  HD information given to the patient  RX: Reglan  Small, frequent meals     Duane LopeJennifer I Rasch, NP 03/07/2015 3:38 PM

## 2015-10-25 ENCOUNTER — Inpatient Hospital Stay (HOSPITAL_COMMUNITY)
Admission: AD | Admit: 2015-10-25 | Discharge: 2015-10-25 | Disposition: A | Discharge status: Home / Self Care | Payer: Self-pay | Source: Ambulatory Visit | Attending: Obstetrics & Gynecology | Admitting: Obstetrics & Gynecology

## 2015-10-25 ENCOUNTER — Encounter (HOSPITAL_COMMUNITY): Payer: Self-pay | Admitting: *Deleted

## 2015-10-25 DIAGNOSIS — O210 Mild hyperemesis gravidarum: Secondary | ICD-10-CM | POA: Insufficient documentation

## 2015-10-25 DIAGNOSIS — O219 Vomiting of pregnancy, unspecified: Secondary | ICD-10-CM

## 2015-10-25 DIAGNOSIS — Z3A01 Less than 8 weeks gestation of pregnancy: Secondary | ICD-10-CM | POA: Insufficient documentation

## 2015-10-25 DIAGNOSIS — F17200 Nicotine dependence, unspecified, uncomplicated: Secondary | ICD-10-CM | POA: Insufficient documentation

## 2015-10-25 LAB — URINALYSIS, ROUTINE W REFLEX MICROSCOPIC
BILIRUBIN URINE: NEGATIVE
GLUCOSE, UA: NEGATIVE mg/dL
HGB URINE DIPSTICK: NEGATIVE
Ketones, ur: NEGATIVE mg/dL
Leukocytes, UA: NEGATIVE
Nitrite: NEGATIVE
Protein, ur: NEGATIVE mg/dL
SPECIFIC GRAVITY, URINE: 1.02 (ref 1.005–1.030)
pH: 6 (ref 5.0–8.0)

## 2015-10-25 LAB — POCT PREGNANCY, URINE: PREG TEST UR: POSITIVE — AB

## 2015-10-25 MED ORDER — PROMETHAZINE HCL 25 MG PO TABS: 25.0000 mg | ORAL_TABLET | Freq: Four times a day (QID) | ORAL | Status: DC | PRN

## 2015-10-25 NOTE — Discharge Instructions (Signed)

## 2015-10-25 NOTE — MAU Provider Note (Signed)
  History     CSN: [id number]  Arrival date and time: 10/25/15 1840   None     Chief Complaint  Patient presents with  . Morning Sickness   HPI [name] is a 24yo [id number] @ 6.0wks by LMP who presents to verify her pregnancy and to receive tx for N/V. Denies pain or bldg; no fever or dysuria. [facility]  OB History    Gravida Para Term Preterm AB TAB SAB Ectopic Multiple Living   5 1 1  3 2 1   1       Past Medical History  Diagnosis Date  . Chlamydia 2008  . Normal pregnancy 08/17/2012  . SVD (spontaneous vaginal delivery) 08/17/2012    Past Surgical History  Procedure Laterality Date  . No past surgeries      No family history on file.  Social History  Substance Use Topics  . Smoking status: Current Every Day Smoker    Last Attempt to Quit: 12/21/2011  . Smokeless tobacco: Never Used  . Alcohol Use: No    Allergies: No Known Allergies  Prescriptions prior to admission  Medication Sig Dispense Refill Last Dose  . acetaminophen (TYLENOL) 500 MG tablet Take 500 mg by mouth every 6 (six) hours as needed for mild pain or headache.   03/06/2015 at Unknown time  . albuterol (PROVENTIL HFA;VENTOLIN HFA) 108 (90 BASE) MCG/ACT inhaler Inhale 2 puffs into the lungs every 6 (six) hours as needed. For shortness of breath   rescue  . metoCLOPramide (REGLAN) 10 MG tablet Take 1 tablet (10 mg total) by mouth 3 (three) times daily before meals. 30 tablet 0   . promethazine (PHENERGAN) 12.5 MG tablet Take 1 tablet (12.5 mg total) by mouth every 6 (six) hours as needed for nausea or vomiting. 30 tablet 0     ROS Physical Exam   Blood pressure 106/62, pulse 85, temperature 98.2 F (36.8 C), temperature source Oral, resp. rate 20, height 5\' 3"  (1.6 m), weight 68.72 kg (151 lb 8 oz), last menstrual period 09/13/2015, unknown if currently breastfeeding.  Physical Exam  Constitutional: She is oriented to person, place, and time. She appears  well-developed.  HENT:  Head: Normocephalic.  Neck: Normal range of motion.  Cardiovascular: Normal rate.   Respiratory: Effort normal.  Genitourinary:  Pelvic not indicated  Musculoskeletal: Normal range of motion.  Neurological: She is alert and oriented to person, place, and time.  Skin: Skin is warm and dry.  Psychiatric: She has a normal mood and affect. Her behavior is normal. Thought content normal.   Urinalysis    Component Value Date/Time   COLORURINE YELLOW 10/25/2015 1925   APPEARANCEUR CLEAR 10/25/2015 1925   LABSPEC 1.020 10/25/2015 1925   PHURINE 6.0 10/25/2015 1925   GLUCOSEU NEGATIVE 10/25/2015 1925   HGBUR NEGATIVE 10/25/2015 1925   BILIRUBINUR NEGATIVE 10/25/2015 1925   KETONESUR NEGATIVE 10/25/2015 1925   PROTEINUR NEGATIVE 10/25/2015 1925   UROBILINOGEN 0.2 03/07/2015 1340   NITRITE NEGATIVE 10/25/2015 1925   LEUKOCYTESUR NEGATIVE 10/25/2015 1925    UPT: negative  MAU Course  Procedures  MDM UA UPT  Assessment and Plan  Early preg 6.0wks N/V of pregnancy  Rx Phenergan 25mg  tabs q 6hrs prn [facility] care  Cam HaiSHAW, KIMBERLY CNM 10/25/2015, 8:40 PM

## 2015-10-25 NOTE — MAU Note (Addendum)
PT  SAYS   FIRST PREG  WAS  VAG   AND  2-3  WERE  TAB.      LMP-   11-17.     NO  BIRTH  CONTROL  .  LAST   SEX-   YESTERDAY.  HPT-  DONE  ON        NOV-  NEG  BUT  DEC-  WAS  POSITIVE.        FEELS  NAUSEATED   .    PLANS  PNC-   [place]  [facility].            NO VOMITING      SOMETIMES   FEELS  A STABBING   PAIN  IN ABD  - LAST  TIME  WAS   IN LOBBY-   NO MEDS.

## 2015-10-28 NOTE — L&D Delivery Note (Signed)
Delivery Note With 2 sets of pushes, pt did very well and delivered.  At 3:36 PM a viable and healthy female was delivered via Vaginal, Spontaneous Delivery (Presentation: OA; LOT ).  APGAR: 9,9 ; weight  .   Placenta status: manually extracted, appeared intact, .  Cord:  3V with the following complications: none.    Anesthesia: epidural  Episiotomy: None Lacerations: Labial abrasion - hemostatic Suture Repair: N/A Est. Blood Loss (mL): 100cc  Mom to postpartum.  Baby to Couplet care / Skin to Skin.  Bovard-Stuckert, Harvin Konicek 06/18/2016, 3:52 PM  Bo/ RI/No Tdap in PNC/Contra?/A+  D/w pt circumcision for female infant, including r/b/a wants to proceed in office

## 2016-01-04 ENCOUNTER — Inpatient Hospital Stay (HOSPITAL_COMMUNITY)
Admission: AD | Admit: 2016-01-04 | Discharge: 2016-01-04 | Disposition: A | Discharge status: Home / Self Care | Payer: Medicaid Other | Source: Ambulatory Visit | Attending: Family Medicine | Admitting: Family Medicine

## 2016-01-04 ENCOUNTER — Encounter (HOSPITAL_COMMUNITY): Payer: Self-pay

## 2016-01-04 DIAGNOSIS — F17200 Nicotine dependence, unspecified, uncomplicated: Secondary | ICD-10-CM | POA: Diagnosis not present

## 2016-01-04 DIAGNOSIS — N949 Unspecified condition associated with female genital organs and menstrual cycle: Secondary | ICD-10-CM

## 2016-01-04 DIAGNOSIS — O9989 Other specified diseases and conditions complicating pregnancy, childbirth and the puerperium: Secondary | ICD-10-CM

## 2016-01-04 DIAGNOSIS — O26892 Other specified pregnancy related conditions, second trimester: Secondary | ICD-10-CM | POA: Diagnosis present

## 2016-01-04 DIAGNOSIS — Z3A16 16 weeks gestation of pregnancy: Secondary | ICD-10-CM | POA: Diagnosis not present

## 2016-01-04 DIAGNOSIS — O99332 Smoking (tobacco) complicating pregnancy, second trimester: Secondary | ICD-10-CM | POA: Insufficient documentation

## 2016-01-04 DIAGNOSIS — R51 Headache: Secondary | ICD-10-CM | POA: Insufficient documentation

## 2016-01-04 DIAGNOSIS — R102 Pelvic and perineal pain: Secondary | ICD-10-CM | POA: Diagnosis not present

## 2016-01-04 LAB — URINALYSIS, ROUTINE W REFLEX MICROSCOPIC
Bilirubin Urine: NEGATIVE
Glucose, UA: NEGATIVE mg/dL
Hgb urine dipstick: NEGATIVE
KETONES UR: NEGATIVE mg/dL
LEUKOCYTES UA: NEGATIVE
NITRITE: NEGATIVE
PH: 7 (ref 5.0–8.0)
Protein, ur: NEGATIVE mg/dL
Specific Gravity, Urine: 1.01 (ref 1.005–1.030)

## 2016-01-04 LAB — WET PREP, GENITAL
CLUE CELLS WET PREP: NONE SEEN
Sperm: NONE SEEN
Trich, Wet Prep: NONE SEEN
WBC WET PREP: NONE SEEN
Yeast Wet Prep HPF POC: NONE SEEN

## 2016-01-04 LAB — CBC WITH DIFFERENTIAL/PLATELET
BASOS PCT: 0 %
Basophils Absolute: 0 10*3/uL (ref 0.0–0.1)
Eosinophils Absolute: 0.1 10*3/uL (ref 0.0–0.7)
Eosinophils Relative: 1 %
HEMATOCRIT: 32.6 % — AB (ref 36.0–46.0)
HEMOGLOBIN: 11.1 g/dL — AB (ref 12.0–15.0)
Lymphocytes Relative: 26 %
Lymphs Abs: 2.6 10*3/uL (ref 0.7–4.0)
MCH: 32.4 pg (ref 26.0–34.0)
MCHC: 34 g/dL (ref 30.0–36.0)
MCV: 95 fL (ref 78.0–100.0)
MONOS PCT: 6 %
Monocytes Absolute: 0.6 10*3/uL (ref 0.1–1.0)
NEUTROS ABS: 6.7 10*3/uL (ref 1.7–7.7)
NEUTROS PCT: 67 %
Platelets: 228 10*3/uL (ref 150–400)
RBC: 3.43 MIL/uL — AB (ref 3.87–5.11)
RDW: 13 % (ref 11.5–15.5)
WBC: 10.1 10*3/uL (ref 4.0–10.5)

## 2016-01-04 MED ORDER — PROMETHAZINE HCL 25 MG PO TABS: 25.0000 mg | ORAL_TABLET | Freq: Four times a day (QID) | ORAL | Status: DC | PRN

## 2016-01-04 MED ORDER — ONDANSETRON 8 MG PO TBDP
8.0000 mg | ORAL_TABLET | Freq: Once | ORAL | Status: AC
  Administered 2016-01-04: 8 mg via ORAL
  Filled 2016-01-04: qty 1

## 2016-01-04 NOTE — Discharge Instructions (Signed)

## 2016-01-04 NOTE — MAU Note (Signed)
Pain in lower abd for last week and a half.  Fairly constant now.  Just aches.  Denies GI issues.

## 2016-01-04 NOTE — MAU Provider Note (Signed)
History     CSN: [id number]  Arrival date and time: 01/04/16 [id number]   First Provider Initiated Contact with Patient 01/04/16 1901      Chief Complaint  Patient presents with  . Abdominal Pain   HPI Ms. Sabas Sousrica Goetzke is a 25 y.o. G5P1031 at 5974w1d who presents to MAU today with complaint of constant lower abdominal pain in the right inguinal region x 2 weeks. She states pain has been worse today. She rates pain as moderate. She has continued to have N/V throughout the pregnancy. This is unchanged from earlier in the pregnancy. She has been given Phenergan, but is not taking it. She denies vaginal bleeding, discharge, fever or fatigue. She endorses mild headache today, rated at 5/10. She has not taken anything for pain.   OB History    Gravida Para Term Preterm AB TAB SAB Ectopic Multiple Living   5 1 1  3 2 1   1       Past Medical History  Diagnosis Date  . Chlamydia 2008  . Normal pregnancy 08/17/2012  . SVD (spontaneous vaginal delivery) 08/17/2012    Past Surgical History  Procedure Laterality Date  . No past surgeries      History reviewed. No pertinent family history.  Social History  Substance Use Topics  . Smoking status: Current Every Day Smoker    Last Attempt to Quit: 12/21/2011  . Smokeless tobacco: Never Used  . Alcohol Use: No    Allergies: No Known Allergies  Prescriptions prior to admission  Medication Sig Dispense Refill Last Dose  . acetaminophen (TYLENOL) 500 MG tablet Take 500 mg by mouth every 6 (six) hours as needed for mild pain or headache.   03/06/2015 at Unknown time  . [DISCONTINUED] promethazine (PHENERGAN) 25 MG tablet Take 1 tablet (25 mg total) by mouth every 6 (six) hours as needed for nausea or vomiting. 30 tablet 1     ROS Physical Exam   Blood pressure 106/51, pulse 85, temperature 97.8 F (36.6 C), temperature source Oral, resp. rate 16, last menstrual period 09/13/2015, unknown if currently breastfeeding.  Physical Exam   Nursing note and vitals reviewed. Constitutional: She is oriented to person, place, and time. She appears well-developed and well-nourished. No distress.  HENT:  Head: Normocephalic and atraumatic.  Cardiovascular: Normal rate.   Respiratory: Effort normal.  GI: Soft. She exhibits no distension and no mass. There is no tenderness. There is no rebound and no guarding.  Genitourinary: Uterus is enlarged (appropriate). Uterus is not tender. Cervix exhibits no motion tenderness, no discharge and no friability. No bleeding in the vagina. Vaginal discharge (scant thin, white discharge noted) found.  Neurological: She is alert and oriented to person, place, and time.  Skin: Skin is warm and dry. No erythema.  Psychiatric: She has a normal mood and affect.  Dilation: Closed Effacement (%): Thick Cervical Position: Posterior Exam by:: Harlon FlorJ. Wenzel, PA-C    Results for orders placed or performed during the hospital encounter of 01/04/16 (from the past 24 hour(s))  Urinalysis, Routine w reflex microscopic (not at [facility])     Status: None   Collection Time: 01/04/16  6:45 PM  Result Value Ref Range   Color, Urine YELLOW YELLOW   APPearance CLEAR CLEAR   Specific Gravity, Urine 1.010 1.005 - 1.030   pH 7.0 5.0 - 8.0   Glucose, UA NEGATIVE NEGATIVE mg/dL   Hgb urine dipstick NEGATIVE NEGATIVE   Bilirubin Urine NEGATIVE NEGATIVE   Ketones, ur NEGATIVE  NEGATIVE mg/dL   Protein, ur NEGATIVE NEGATIVE mg/dL   Nitrite NEGATIVE NEGATIVE   Leukocytes, UA NEGATIVE NEGATIVE  Wet prep, genital     Status: None   Collection Time: 01/04/16  7:20 PM  Result Value Ref Range   Yeast Wet Prep HPF POC NONE SEEN NONE SEEN   Trich, Wet Prep NONE SEEN NONE SEEN   Clue Cells Wet Prep HPF POC NONE SEEN NONE SEEN   WBC, Wet Prep HPF POC NONE SEEN NONE SEEN   Sperm NONE SEEN   CBC with Differential/Platelet     Status: Abnormal   Collection Time: 01/04/16  7:24 PM  Result Value Ref Range   WBC 10.1 4.0 - 10.5 K/uL    RBC 3.43 (L) 3.87 - 5.11 MIL/uL   Hemoglobin 11.1 (L) 12.0 - 15.0 g/dL   HCT 40.9 (L) 81.1 - 91.4 %   MCV 95.0 78.0 - 100.0 fL   MCH 32.4 26.0 - 34.0 pg   MCHC 34.0 30.0 - 36.0 g/dL   RDW 78.2 95.6 - 21.3 %   Platelets 228 150 - 400 K/uL   Neutrophils Relative % 67 %   Neutro Abs 6.7 1.7 - 7.7 K/uL   Lymphocytes Relative 26 %   Lymphs Abs 2.6 0.7 - 4.0 K/uL   Monocytes Relative 6 %   Monocytes Absolute 0.6 0.1 - 1.0 K/uL   Eosinophils Relative 1 %   Eosinophils Absolute 0.1 0.0 - 0.7 K/uL   Basophils Relative 0 %   Basophils Absolute 0.0 0.0 - 0.1 K/uL    MAU Course  Procedures None  MDM FHR - 146 bpm with doppler UA, wet prep and GC/Chlamydia today CBC ordered. WBCs WNL. Patient non-tender on exam, afebrile. Very low concern for appendicitis.   Assessment and Plan  A: SIUP at [redacted]w[redacted]d Round ligament pain  Nausea and vomiting in pregnancy prior to [redacted] weeks gestation   P: Discharge home Rx for Phenergan given to patient Tylenol PRN for pain advised Discussed use of abdominal binder and warm bath/shower for round ligament pain Second trimester precautions discussed Patient advised to follow-up with [facility] if desired to start prenatal care Patient may return to MAU as needed or if her condition were to change or worsen   Marny Lowenstein, PA-C  01/04/2016, 7:55 PM

## 2016-01-07 LAB — GC/CHLAMYDIA PROBE AMP (~~LOC~~) NOT AT ARMC
Chlamydia: NEGATIVE
NEISSERIA GONORRHEA: NEGATIVE

## 2016-02-26 LAB — OB RESULTS CONSOLE RPR: RPR: NONREACTIVE

## 2016-02-26 LAB — OB RESULTS CONSOLE ANTIBODY SCREEN: ANTIBODY SCREEN: NEGATIVE

## 2016-02-26 LAB — OB RESULTS CONSOLE HIV ANTIBODY (ROUTINE TESTING): HIV: NONREACTIVE

## 2016-02-26 LAB — OB RESULTS CONSOLE GC/CHLAMYDIA
Chlamydia: NEGATIVE
Gonorrhea: NEGATIVE

## 2016-02-26 LAB — OB RESULTS CONSOLE HEPATITIS B SURFACE ANTIGEN: HEP B S AG: NEGATIVE

## 2016-02-26 LAB — OB RESULTS CONSOLE RUBELLA ANTIBODY, IGM: RUBELLA: NON-IMMUNE/NOT IMMUNE

## 2016-02-26 LAB — OB RESULTS CONSOLE ABO/RH: RH Type: POSITIVE

## 2016-03-05 ENCOUNTER — Inpatient Hospital Stay (HOSPITAL_COMMUNITY)
Admission: AD | Admit: 2016-03-05 | Discharge: 2016-03-05 | Disposition: A | Discharge status: Home / Self Care | Payer: Medicaid Other | Source: Ambulatory Visit | Attending: Obstetrics and Gynecology | Admitting: Obstetrics and Gynecology

## 2016-03-05 ENCOUNTER — Encounter (HOSPITAL_COMMUNITY): Payer: Self-pay | Admitting: Student

## 2016-03-05 DIAGNOSIS — R1031 Right lower quadrant pain: Secondary | ICD-10-CM | POA: Diagnosis present

## 2016-03-05 DIAGNOSIS — R21 Rash and other nonspecific skin eruption: Secondary | ICD-10-CM | POA: Diagnosis not present

## 2016-03-05 DIAGNOSIS — R102 Pelvic and perineal pain: Secondary | ICD-10-CM | POA: Diagnosis not present

## 2016-03-05 DIAGNOSIS — N949 Unspecified condition associated with female genital organs and menstrual cycle: Secondary | ICD-10-CM | POA: Diagnosis not present

## 2016-03-05 DIAGNOSIS — L259 Unspecified contact dermatitis, unspecified cause: Secondary | ICD-10-CM

## 2016-03-05 DIAGNOSIS — O26899 Other specified pregnancy related conditions, unspecified trimester: Secondary | ICD-10-CM

## 2016-03-05 DIAGNOSIS — L309 Dermatitis, unspecified: Secondary | ICD-10-CM | POA: Diagnosis not present

## 2016-03-05 DIAGNOSIS — O9989 Other specified diseases and conditions complicating pregnancy, childbirth and the puerperium: Secondary | ICD-10-CM

## 2016-03-05 DIAGNOSIS — O26892 Other specified pregnancy related conditions, second trimester: Secondary | ICD-10-CM | POA: Diagnosis not present

## 2016-03-05 DIAGNOSIS — O99332 Smoking (tobacco) complicating pregnancy, second trimester: Secondary | ICD-10-CM | POA: Diagnosis not present

## 2016-03-05 LAB — URINALYSIS, ROUTINE W REFLEX MICROSCOPIC
GLUCOSE, UA: NEGATIVE mg/dL
HGB URINE DIPSTICK: NEGATIVE
Nitrite: NEGATIVE
PH: 6 (ref 5.0–8.0)
Protein, ur: NEGATIVE mg/dL
Specific Gravity, Urine: 1.025 (ref 1.005–1.030)

## 2016-03-05 LAB — COMPREHENSIVE METABOLIC PANEL
ALBUMIN: 3.4 g/dL — AB (ref 3.5–5.0)
ALK PHOS: 52 U/L (ref 38–126)
ALT: 11 U/L — AB (ref 14–54)
ANION GAP: 6 (ref 5–15)
AST: 16 U/L (ref 15–41)
BILIRUBIN TOTAL: 0.4 mg/dL (ref 0.3–1.2)
BUN: 8 mg/dL (ref 6–20)
CALCIUM: 8.8 mg/dL — AB (ref 8.9–10.3)
CO2: 24 mmol/L (ref 22–32)
Chloride: 105 mmol/L (ref 101–111)
Creatinine, Ser: 0.57 mg/dL (ref 0.44–1.00)
GFR calc Af Amer: >60 mL/min (ref >60)
GFR calc non Af Amer: >60 mL/min (ref >60)
Glucose, Bld: 94 mg/dL (ref 65–99)
Potassium: 3.8 mmol/L (ref 3.5–5.1)
Sodium: 135 mmol/L (ref 135–145)
TOTAL PROTEIN: 6.7 g/dL (ref 6.5–8.1)

## 2016-03-05 LAB — URINE MICROSCOPIC-ADD ON

## 2016-03-05 LAB — CBC
HEMATOCRIT: 32.3 % — AB (ref 36.0–46.0)
Hemoglobin: 10.9 g/dL — ABNORMAL LOW (ref 12.0–15.0)
MCH: 32.8 pg (ref 26.0–34.0)
MCHC: 33.7 g/dL (ref 30.0–36.0)
MCV: 97.3 fL (ref 78.0–100.0)
Platelets: 238 10*3/uL (ref 150–400)
RBC: 3.32 MIL/uL — AB (ref 3.87–5.11)
RDW: 13 % (ref 11.5–15.5)
WBC: 11.7 10*3/uL — ABNORMAL HIGH (ref 4.0–10.5)

## 2016-03-05 MED ORDER — DEXTROSE 5 % IN LACTATED RINGERS IV BOLUS: 1000.0000 mL | Freq: Once | INTRAVENOUS | Status: DC

## 2016-03-05 MED ORDER — ONDANSETRON HCL 4 MG/2ML IJ SOLN: 4.0000 mg | Freq: Once | INTRAMUSCULAR | Status: DC

## 2016-03-05 NOTE — MAU Note (Signed)
Patient refused IV. PO fluids pushed and encouraged.

## 2016-03-05 NOTE — Discharge Instructions (Signed)
Contact Dermatitis Dermatitis is redness, soreness, and swelling (inflammation) of the skin. Contact dermatitis is a reaction to certain substances that touch the skin. There are two types of contact dermatitis:   Irritant contact dermatitis. This type is caused by something that irritates your skin, such as dry hands from washing them too much. This type does not require previous exposure to the substance for a reaction to occur. This type is more common.  Allergic contact dermatitis. This type is caused by a substance that you are allergic to, such as a nickel allergy or poison ivy. This type only occurs if you have been exposed to the substance (allergen) before. Upon a repeat exposure, your body reacts to the substance. This type is less common. CAUSES  Many different substances can cause contact dermatitis. Irritant contact dermatitis is most commonly caused by exposure to:   Makeup.   Soaps.   Detergents.   Bleaches.   Acids.   Metal salts, such as nickel.  Allergic contact dermatitis is most commonly caused by exposure to:   Poisonous plants.   Chemicals.   Jewelry.   Latex.   Medicines.   Preservatives in products, such as clothing.  RISK FACTORS This condition is more likely to develop in:   People who have jobs that expose them to irritants or allergens.  People who have certain medical conditions, such as asthma or eczema.  SYMPTOMS  Symptoms of this condition may occur anywhere on your body where the irritant has touched you or is touched by you. Symptoms include:  Dryness or flaking.   Redness.   Cracks.   Itching.   Pain or a burning feeling.   Blisters.  Drainage of small amounts of blood or clear fluid from skin cracks. With allergic contact dermatitis, there may also be swelling in areas such as the eyelids, mouth, or genitals.  DIAGNOSIS  This condition is diagnosed with a medical history and physical exam. A patch skin  test may be performed to help determine the cause. If the condition is related to your job, you may need to see an occupational medicine specialist. TREATMENT Treatment for this condition includes figuring out what caused the reaction and protecting your skin from further contact. Treatment may also include:   Steroid creams or ointments. Oral steroid medicines may be needed in more severe cases.  Antibiotics or antibacterial ointments, if a skin infection is present.  Antihistamine lotion or an antihistamine taken by mouth to ease itching.  A bandage (dressing). HOME CARE INSTRUCTIONS Skin Care  Moisturize your skin as needed.   Apply cool compresses to the affected areas.  Try taking a bath with:  Epsom salts. Follow the instructions on the packaging. You can get these at your local pharmacy or grocery store.  Baking soda. Pour a small amount into the bath as directed by your health care provider.  Colloidal oatmeal. Follow the instructions on the packaging. You can get this at your local pharmacy or grocery store.  Try applying baking soda paste to your skin. Stir water into baking soda until it reaches a paste-like consistency.  Do not scratch your skin.  Bathe less frequently, such as every other day.  Bathe in lukewarm water. Avoid using hot water. Medicines  Take or apply over-the-counter and prescription medicines only as told by your health care provider.   If you were prescribed an antibiotic medicine, take or apply your antibiotic as told by your health care provider. Do not stop using the  antibiotic even if your condition starts to improve. General Instructions  Keep all follow-up visits as told by your health care provider. This is important.  Avoid the substance that caused your reaction. If you do not know what caused it, keep a journal to try to track what caused it. Write down:  What you eat.  What cosmetic products you use.  What you  drink.  What you wear in the affected area. This includes jewelry.  If you were given a dressing, take care of it as told by your health care provider. This includes when to change and remove it. SEEK MEDICAL CARE IF:   Your condition does not improve with treatment.  Your condition gets worse.  You have signs of infection such as swelling, tenderness, redness, soreness, or warmth in the affected area.  You have a fever.  You have new symptoms. SEEK IMMEDIATE MEDICAL CARE IF:   You have a severe headache, neck pain, or neck stiffness.  You vomit.  You feel very sleepy.  You notice red streaks coming from the affected area.  Your bone or joint underneath the affected area becomes painful after the skin has healed.  The affected area turns darker.  You have difficulty breathing.   This information is not intended to replace advice given to you by your health care provider. Make sure you discuss any questions you have with your health care provider.   Document Released: 10/10/2000 Document Revised: 07/04/2015 Document Reviewed: 02/28/2015 Elsevier Interactive Patient Education 2016 [employer] Round Ligament Pain The round ligament is a cord of muscle and tissue that helps to support the uterus. It can become a source of pain during pregnancy if it becomes stretched or twisted as the baby grows. The pain usually begins in the second trimester of pregnancy, and it can come and go until the baby is delivered. It is not a serious problem, and it does not cause harm to the baby. Round ligament pain is usually a short, sharp, and pinching pain, but it can also be a dull, lingering, and aching pain. The pain is felt in the lower side of the abdomen or in the groin. It usually starts deep in the groin and moves up to the outside of the hip area. Pain can occur with:  A sudden change in position.  Rolling over in bed.  Coughing or sneezing.  Physical activity. HOME CARE  INSTRUCTIONS Watch your condition for any changes. Take these steps to help with your pain:  When the pain starts, relax. Then try:  Sitting down.  Flexing your knees up to your abdomen.  Lying on your side with one pillow under your abdomen and another pillow between your legs.  Sitting in a warm bath for 15-20 minutes or until the pain goes away.  Take over-the-counter and prescription medicines only as told by your health care provider.  Move slowly when you sit and stand.  Avoid long walks if they cause pain.  Stop or lessen your physical activities if they cause pain. SEEK MEDICAL CARE IF:  Your pain does not go away with treatment.  You feel pain in your back that you did not have before.  Your medicine is not helping. SEEK IMMEDIATE MEDICAL CARE IF:  You develop a fever or chills.  You develop uterine contractions.  You develop vaginal bleeding.  You develop nausea or vomiting.  You develop diarrhea.  You have pain when you urinate.   This information is not intended  to replace advice given to you by your health care provider. Make sure you discuss any questions you have with your health care provider.   Document Released: 07/22/2008 Document Revised: 01/05/2012 Document Reviewed: 12/20/2014 Elsevier Interactive Patient Education [employer]

## 2016-03-05 NOTE — MAU Note (Signed)
Pt states Dr. Ellyn HackBovard wrote letter for work for pt not to lift over 25 pounds.  Pt states they would not accept it at work and had pt lifting cases of 20oz and 40oz drinks into the cooler yesterday.  Pt states she starting spotting at work but has stopped today.  Rt lower abd cramping pain continues.  Good fetal movement but slightly decreased.  Pt also wants rash on left lower arm looked at.  Pt has had it about 1 week now.

## 2016-03-05 NOTE — MAU Provider Note (Signed)
History     CSN: [id number]  Arrival date and time: 03/05/16 1439    First Provider Initiated Contact with Patient 03/05/16 1532       Chief Complaint  Patient presents with  . Abdominal Pain  . Rash   HPI  Jordan Fowler is a 25 y.o. G4P1021 at [redacted]w[redacted]d who presents with abdominal pain and a rash. Reports RLQ pain since yesterday after working. Describes pain as constant dull aching. Rates pain 4/10. No treatment. Denies aggravating or alleviating factors. Reports 2 episodes of pink spotting on toilet paper last night, no bleeding since then. Last intercourse was 4 days ago. Denies LOF. Vomited once last night and once this morning after eating. Denies nausea. Denies diarrhea or constipation.  Positive fetal movement.  Also reports rash on left forearm. First noticed last week. Rash comes & goes in the same location. Has tried benadryl cream with relief of itching but doesn't make the rash going away. Denies any changes in soaps or detergents & denies any contact with outside plants of animals.   OB History    Gravida Para Term Preterm AB TAB SAB Ectopic Multiple Living   0   1      Past Medical History  Diagnosis Date  . Chlamydia 2008  . Normal pregnancy 08/17/2012  . SVD (spontaneous vaginal delivery) 08/17/2012    Past Surgical History  Procedure Laterality Date  . No past surgeries      History reviewed. No pertinent family history.  Social History  Substance Use Topics  . Smoking status: Current Every Day Smoker    Last Attempt to Quit: 12/21/2011  . Smokeless tobacco: Never Used  . Alcohol Use: No    Allergies: No Known Allergies  Prescriptions prior to admission  Medication Sig Dispense Refill Last Dose  . acetaminophen (TYLENOL) 500 MG tablet Take 500 mg by mouth every 6 (six) hours as needed for mild pain or headache.   03/05/2016 at Unknown time  . DIPHENHYDRAMINE HCL, TOPICAL, (BENADRYL ITCH STOPPING) 2 % GEL Apply 1 application topically daily  as needed (rash).   Past Week at Unknown time  . promethazine (PHENERGAN) 25 MG tablet Take 1 tablet (25 mg total) by mouth every 6 (six) hours as needed for nausea or vomiting. (Patient not taking: Reported on 03/05/2016) 30 tablet 1 Not Taking at Unknown time    Review of Systems  Constitutional: Negative.   Gastrointestinal: Positive for vomiting and abdominal pain. Negative for nausea, diarrhea and constipation.  Genitourinary: Negative for dysuria.       + vaginal bleeding (spotting last night)  Skin: Positive for itching and rash.   Physical Exam   Blood pressure 102/60, pulse 96, temperature 98 F (36.7 C), temperature source Oral, resp. rate 18, height 5' 3.5" (1.613 m), weight 159 lb 6.4 oz (72.303 kg), last menstrual period 09/13/2015, SpO2 100 %, unknown if currently breastfeeding.  Physical Exam  Nursing note and vitals reviewed. Constitutional: She is oriented to person, place, and time. She appears well-developed and well-nourished. No distress.  HENT:  Head: Normocephalic and atraumatic.  Eyes: Conjunctivae are normal. Right eye exhibits no discharge. Left eye exhibits no discharge. No scleral icterus.  Neck: Normal range of motion.  Cardiovascular: Normal rate, regular rhythm and normal heart sounds.   No murmur heard. Respiratory: Effort normal and breath sounds normal. No respiratory distress. She has no wheezes.  GI: Soft. Bowel sounds are normal. There is no tenderness.  There is no rebound and no guarding.  Genitourinary:  Cervix closed No blood  Neurological: She is alert and oriented to person, place, and time.  Skin: Skin is warm and dry. She is not diaphoretic.  Psychiatric: She has a normal mood and affect. Her behavior is normal. Judgment and thought content normal.     Fetal Tracing:  Baseline: 140 Variability: moderate Accelerations: 10x10 Decelerations: occasional variable  Toco: none    MAU Course  Procedures Results for orders placed or  performed during the hospital encounter of 03/05/16 (from the past 24 hour(s))  Urinalysis, Routine w reflex microscopic (not [facility])     Status: Abnormal   Collection Time: 03/05/16  2:40 PM  Result Value Ref Range   Color, Urine YELLOW YELLOW   APPearance HAZY (A) CLEAR   Specific Gravity, Urine 1.025 1.005 - 1.030   pH 6.0 5.0 - 8.0   Glucose, UA NEGATIVE NEGATIVE mg/dL   Hgb urine dipstick NEGATIVE NEGATIVE   Bilirubin Urine SMALL (A) NEGATIVE   Ketones, ur >80 (A) NEGATIVE mg/dL   Protein, ur NEGATIVE NEGATIVE mg/dL   Nitrite NEGATIVE NEGATIVE   Leukocytes, UA MODERATE (A) NEGATIVE  Urine microscopic-add on     Status: Abnormal   Collection Time: 03/05/16  2:40 PM  Result Value Ref Range   Squamous Epithelial / LPF 6-30 (A) NONE SEEN   WBC, UA 0-5 0 - 5 WBC/hpf   RBC / HPF 0-5 0 - 5 RBC/hpf   Bacteria, UA MANY (A) NONE SEEN  CBC     Status: Abnormal   Collection Time: 03/05/16  3:58 PM  Result Value Ref Range   WBC 11.7 (H) 4.0 - 10.5 K/uL   RBC 3.32 (L) 3.87 - 5.11 MIL/uL   Hemoglobin 10.9 (L) 12.0 - 15.0 g/dL   HCT 40.932.3 (L) 81.136.0 - 91.446.0 %   MCV 97.3 78.0 - 100.0 fL   MCH 32.8 26.0 - 34.0 pg   MCHC 33.7 30.0 - 36.0 g/dL   RDW 78.213.0 95.611.5 - 21.315.5 %   Platelets 238 150 - 400 K/uL  Comprehensive metabolic panel     Status: Abnormal   Collection Time: 03/05/16  3:58 PM  Result Value Ref Range   Sodium 135 135 - 145 mmol/L   Potassium 3.8 3.5 - 5.1 mmol/L   Chloride 105 101 - 111 mmol/L   CO2 24 22 - 32 mmol/L   Glucose, Bld 94 65 - 99 mg/dL   BUN 8 6 - 20 mg/dL   Creatinine, Ser 0.860.57 0.44 - 1.00 mg/dL   Calcium 8.8 (L) 8.9 - 10.3 mg/dL   Total Protein 6.7 6.5 - 8.1 g/dL   Albumin 3.4 (L) 3.5 - 5.0 g/dL   AST 16 15 - 41 U/L   ALT 11 (L) 14 - 54 U/L   Alkaline Phosphatase 52 38 - 126 U/L   Total Bilirubin 0.4 0.3 - 1.2 mg/dL   GFR calc non Af Amer >60 >60 mL/min   GFR calc Af Amer >60 >60 mL/min   Anion gap 6 5 - 15    MDM Fetal tracing appropriate for  gestation No contractions Cervix closed No blood on exam Pt agreeable to IV fluids d/t >80 ketones on urine; pt refused IV when RN attempted --- given pitcher of water CBC, CMP S/w Dr. Jackelyn KnifeMeisinger. Ok to Discharge home. Try OTC hydrocortisone cream for rash and call office as needed.   Assessment and Plan  A: 1. Pain of round  ligament affecting pregnancy, antepartum   2. Contact dermatitis and eczema    P; Discharge home OTC hydrocortisone cream for rash Benadryl po tonight per package instruction Call office if rash doesn't improve Recommended maternity support belt to wear at work Preterm labor precautions  Judeth Horn 03/05/2016, 3:31 PM

## 2016-05-13 LAB — OB RESULTS CONSOLE GBS: GBS: POSITIVE

## 2016-06-12 ENCOUNTER — Inpatient Hospital Stay (HOSPITAL_COMMUNITY)
Admission: AD | Admit: 2016-06-12 | Payer: Medicaid Other | Source: Ambulatory Visit | Admitting: Obstetrics and Gynecology

## 2016-06-12 ENCOUNTER — Telehealth (HOSPITAL_COMMUNITY): Payer: Self-pay | Admitting: *Deleted

## 2016-06-12 ENCOUNTER — Encounter (HOSPITAL_COMMUNITY): Payer: Self-pay | Admitting: *Deleted

## 2016-06-12 NOTE — Telephone Encounter (Signed)
Preadmission screen  

## 2016-06-13 ENCOUNTER — Encounter (HOSPITAL_COMMUNITY): Payer: Self-pay | Admitting: *Deleted

## 2016-06-16 DIAGNOSIS — O0933 Supervision of pregnancy with insufficient antenatal care, third trimester: Secondary | ICD-10-CM

## 2016-06-16 HISTORY — DX: Supervision of pregnancy with insufficient antenatal care, third trimester: O09.33

## 2016-06-16 NOTE — H&P (Signed)
Jordan Fowler is a 25 y.o. female [phone] at 8+ with late entry to care at about 21 weeks.  Relatively uncomplicated PNC.  Rubella nonimmune - vaccine pp.  IOL given after EDC and favorable cervix.  D/w pt process.  OB History    Gravida Para Term Preterm AB Living   4 1 1   2 1    SAB TAB Ectopic Multiple Live Births   0 2     1    G1 SVD female 6#10 2013 female G2 and G3 TAB G4 present  No abn pap H/O Chl Past Medical History:  Diagnosis Date  . Chlamydia 2008  . Late prenatal care affecting pregnancy in second trimester   . Normal pregnancy 08/17/2012  . SVD (spontaneous vaginal delivery) 08/17/2012   Past Surgical History:  Procedure Laterality Date  . NO PAST SURGERIES    TAB x 2  Family History: family history includes Diabetes in her maternal grandmother and paternal grandmother. Social History:  reports that she quit smoking about 7 months ago. She has never used smokeless tobacco. She reports that she does not drink alcohol or use drugs. single Meds: none All: NKDA     Maternal Diabetes: No Genetic Screening: Declined Maternal Ultrasounds/Referrals: Normal Fetal Ultrasounds or other Referrals:  None Maternal Substance Abuse:  Yes:  Type: Other: h/o tobacco Significant Maternal Medications:  None Significant Maternal Lab Results:  Lab values include: Group B Strep positive Other Comments:  essential panel negative  Review of Systems  Constitutional: Negative.   HENT: Negative.   Eyes: Negative.   Respiratory: Negative.   Cardiovascular: Negative.   Gastrointestinal: Positive for abdominal pain.  Genitourinary: Negative.   Musculoskeletal: Positive for back pain.  Skin: Negative.   Neurological: Negative.   Psychiatric/Behavioral: Negative.    Maternal Medical History:  Contractions: Frequency: irregular.    Fetal activity: Perceived fetal activity is normal.    Prenatal complications: no prenatal complications Prenatal Complications - Diabetes:  none.      Last menstrual period 09/13/2015, unknown if currently breastfeeding. Maternal Exam:  Uterine Assessment: Contraction frequency is irregular.   Abdomen: Fundal height is appropriate for gestation.   Fetal presentation: vertex  Introitus: Normal vulva. Normal vagina.  Pelvis: adequate for delivery.   Cervix: Cervix evaluated by digital exam.     Physical Exam  Constitutional: She is oriented to person, place, and time. She appears well-developed and well-nourished.  HENT:  Head: Normocephalic and atraumatic.  Cardiovascular: Normal rate and regular rhythm.   Respiratory: Breath sounds normal. No respiratory distress. She has no wheezes.  GI: Soft. Bowel sounds are normal. She exhibits no distension. There is no tenderness.  Musculoskeletal: Normal range of motion.  Neurological: She is alert and oriented to person, place, and time.  Skin: Skin is warm and dry.  Psychiatric: She has a normal mood and affect. Her behavior is normal.    Prenatal labs: ABO, Rh: A/Positive/-- (05/02 0000) Antibody: Negative (05/02 0000) Rubella: Nonimmune (05/02 0000) RPR: Nonreactive (05/02 0000)  HBsAg: Negative (05/02 0000)  HIV: Non-reactive (05/02 0000)  GBS: Positive (07/18 0000)  Hgb 11.8/plt280K/GC neg/Chl neg/CF, AFP and Fragile X neg/ glucola 83/hgb electro WNL  Dated by 20 wk [place].  Fetal EIF, ant R plac, nl anat, female  Assessment/Plan: 25yo [id number] at 40+ for IOL, term and favorable cervix GBBS + PCN for prophylaxis Epidural, nitrous or stadol PRN Expect SVD RNI - MMR after delivery  Bovard-Stuckert, Jordan Fowler 06/16/2016, 10:36 PM

## 2016-06-18 ENCOUNTER — Inpatient Hospital Stay (HOSPITAL_COMMUNITY): Payer: Medicaid Other | Admitting: Anesthesiology

## 2016-06-18 ENCOUNTER — Encounter (HOSPITAL_COMMUNITY): Payer: Self-pay

## 2016-06-18 ENCOUNTER — Inpatient Hospital Stay (HOSPITAL_COMMUNITY)
Admission: RE | Admit: 2016-06-18 | Discharge: 2016-06-20 | DRG: 775 | Disposition: A | Discharge status: Home / Self Care | Payer: Medicaid Other | Source: Ambulatory Visit | Attending: Obstetrics and Gynecology | Admitting: Obstetrics and Gynecology

## 2016-06-18 DIAGNOSIS — Z3483 Encounter for supervision of other normal pregnancy, third trimester: Secondary | ICD-10-CM | POA: Diagnosis present

## 2016-06-18 DIAGNOSIS — Z3A40 40 weeks gestation of pregnancy: Secondary | ICD-10-CM | POA: Diagnosis not present

## 2016-06-18 DIAGNOSIS — Z833 Family history of diabetes mellitus: Secondary | ICD-10-CM

## 2016-06-18 DIAGNOSIS — O99824 Streptococcus B carrier state complicating childbirth: Secondary | ICD-10-CM | POA: Diagnosis present

## 2016-06-18 DIAGNOSIS — O0933 Supervision of pregnancy with insufficient antenatal care, third trimester: Secondary | ICD-10-CM

## 2016-06-18 DIAGNOSIS — Z87891 Personal history of nicotine dependence: Secondary | ICD-10-CM

## 2016-06-18 LAB — CBC
HEMATOCRIT: 32.3 % — AB (ref 36.0–46.0)
HEMOGLOBIN: 10.8 g/dL — AB (ref 12.0–15.0)
MCH: 31.7 pg (ref 26.0–34.0)
MCHC: 33.4 g/dL (ref 30.0–36.0)
MCV: 94.7 fL (ref 78.0–100.0)
Platelets: 321 10*3/uL (ref 150–400)
RBC: 3.41 MIL/uL — ABNORMAL LOW (ref 3.87–5.11)
RDW: 13.9 % (ref 11.5–15.5)
WBC: 19 10*3/uL — AB (ref 4.0–10.5)

## 2016-06-18 LAB — TYPE AND SCREEN
ABO/RH(D): A POS
ANTIBODY SCREEN: NEGATIVE

## 2016-06-18 LAB — RPR: RPR Ser Ql: NONREACTIVE

## 2016-06-18 LAB — ABO/RH: ABO/RH(D): A POS

## 2016-06-18 MED ORDER — DIPHENHYDRAMINE HCL 25 MG PO CAPS
25.0000 mg | ORAL_CAPSULE | Freq: Four times a day (QID) | ORAL | Status: DC | PRN
Start: 2016-06-18 — End: 2016-06-20

## 2016-06-18 MED ORDER — ACETAMINOPHEN 325 MG PO TABS: 650.0000 mg | ORAL_TABLET | ORAL | Status: DC | PRN

## 2016-06-18 MED ORDER — TETANUS-DIPHTH-ACELL PERTUSSIS 5-2.5-18.5 LF-MCG/0.5 IM SUSP: 0.5000 mL | Freq: Once | INTRAMUSCULAR | Status: DC

## 2016-06-18 MED ORDER — DEXTROSE 5 % IV SOLN
2.0000 g | Freq: Two times a day (BID) | INTRAVENOUS | Status: AC
  Administered 2016-06-18 – 2016-06-19 (×2): 2 g via INTRAVENOUS
  Filled 2016-06-18 (×2): qty 2

## 2016-06-18 MED ORDER — DIPHENHYDRAMINE HCL 50 MG/ML IJ SOLN: 12.5000 mg | INTRAMUSCULAR | Status: DC | PRN

## 2016-06-18 MED ORDER — EPHEDRINE 5 MG/ML INJ
10.0000 mg | INTRAVENOUS | Status: DC | PRN
  Filled 2016-06-18: qty 4

## 2016-06-18 MED ORDER — ONDANSETRON HCL 4 MG/2ML IJ SOLN: 4.0000 mg | Freq: Four times a day (QID) | INTRAMUSCULAR | Status: DC | PRN

## 2016-06-18 MED ORDER — LACTATED RINGERS IV SOLN
500.0000 mL | Freq: Once | INTRAVENOUS | Status: AC
  Administered 2016-06-18: 500 mL via INTRAVENOUS

## 2016-06-18 MED ORDER — ONDANSETRON HCL 4 MG/2ML IJ SOLN: 4.0000 mg | INTRAMUSCULAR | Status: DC | PRN

## 2016-06-18 MED ORDER — PRENATAL MULTIVITAMIN CH
1.0000 | ORAL_TABLET | Freq: Every day | ORAL | Status: DC
  Administered 2016-06-19: 1 via ORAL
  Filled 2016-06-18: qty 1

## 2016-06-18 MED ORDER — BUTORPHANOL TARTRATE 1 MG/ML IJ SOLN
1.0000 mg | INTRAMUSCULAR | Status: DC | PRN
  Administered 2016-06-18 (×2): 1 mg via INTRAVENOUS
  Filled 2016-06-18 (×2): qty 1

## 2016-06-18 MED ORDER — PENICILLIN G POTASSIUM 5000000 UNITS IJ SOLR
2.5000 10*6.[IU] | INTRAMUSCULAR | Status: DC
  Administered 2016-06-18: 2.5 10*6.[IU] via INTRAVENOUS
  Filled 2016-06-18 (×4): qty 2.5

## 2016-06-18 MED ORDER — PENICILLIN G POTASSIUM 5000000 UNITS IJ SOLR
5.0000 10*6.[IU] | Freq: Once | INTRAVENOUS | Status: AC
  Administered 2016-06-18: 5 10*6.[IU] via INTRAVENOUS
  Filled 2016-06-18: qty 5

## 2016-06-18 MED ORDER — LACTATED RINGERS IV SOLN
INTRAVENOUS | Status: DC
  Administered 2016-06-18 (×2): via INTRAVENOUS

## 2016-06-18 MED ORDER — COCONUT OIL OIL: 1.0000 "application " | TOPICAL_OIL | Status: DC | PRN

## 2016-06-18 MED ORDER — OXYTOCIN BOLUS FROM INFUSION
500.0000 mL | Freq: Once | INTRAVENOUS | Status: AC
  Administered 2016-06-18: 500 mL via INTRAVENOUS

## 2016-06-18 MED ORDER — FENTANYL 2.5 MCG/ML BUPIVACAINE 1/10 % EPIDURAL INFUSION (WH - ANES)
14.0000 mL/h | INTRAMUSCULAR | Status: DC | PRN
  Administered 2016-06-18 (×2): 14 mL/h via EPIDURAL
  Filled 2016-06-18: qty 125

## 2016-06-18 MED ORDER — SIMETHICONE 80 MG PO CHEW: 80.0000 mg | CHEWABLE_TABLET | ORAL | Status: DC | PRN

## 2016-06-18 MED ORDER — ZOLPIDEM TARTRATE 5 MG PO TABS: 5.0000 mg | ORAL_TABLET | Freq: Every evening | ORAL | Status: DC | PRN

## 2016-06-18 MED ORDER — OXYTOCIN 40 UNITS IN LACTATED RINGERS INFUSION - SIMPLE MED: 2.5000 [IU]/h | INTRAVENOUS | Status: DC

## 2016-06-18 MED ORDER — LIDOCAINE HCL (PF) 1 % IJ SOLN
INTRAMUSCULAR | Status: DC | PRN
  Administered 2016-06-18 (×2): 6 mL

## 2016-06-18 MED ORDER — LACTATED RINGERS IV SOLN: 500.0000 mL | INTRAVENOUS | Status: DC | PRN

## 2016-06-18 MED ORDER — ONDANSETRON HCL 4 MG PO TABS: 4.0000 mg | ORAL_TABLET | ORAL | Status: DC | PRN

## 2016-06-18 MED ORDER — LACTATED RINGERS IV SOLN: INTRAVENOUS | Status: DC

## 2016-06-18 MED ORDER — OXYCODONE-ACETAMINOPHEN 5-325 MG PO TABS: 2.0000 | ORAL_TABLET | ORAL | Status: DC | PRN

## 2016-06-18 MED ORDER — OXYCODONE HCL 5 MG PO TABS: 5.0000 mg | ORAL_TABLET | ORAL | Status: DC | PRN

## 2016-06-18 MED ORDER — PNEUMOCOCCAL VAC POLYVALENT 25 MCG/0.5ML IJ INJ
0.5000 mL | INJECTION | INTRAMUSCULAR | Status: DC
  Filled 2016-06-18: qty 0.5

## 2016-06-18 MED ORDER — WITCH HAZEL-GLYCERIN EX PADS: 1.0000 "application " | MEDICATED_PAD | CUTANEOUS | Status: DC | PRN

## 2016-06-18 MED ORDER — PHENYLEPHRINE 40 MCG/ML (10ML) SYRINGE FOR IV PUSH (FOR BLOOD PRESSURE SUPPORT)
80.0000 ug | PREFILLED_SYRINGE | INTRAVENOUS | Status: DC | PRN
  Filled 2016-06-18: qty 5
  Filled 2016-06-18: qty 10

## 2016-06-18 MED ORDER — OXYTOCIN 40 UNITS IN LACTATED RINGERS INFUSION - SIMPLE MED
1.0000 m[IU]/min | INTRAVENOUS | Status: DC
  Administered 2016-06-18: 2 m[IU]/min via INTRAVENOUS
  Administered 2016-06-18: 4 m[IU]/min via INTRAVENOUS
  Filled 2016-06-18: qty 1000

## 2016-06-18 MED ORDER — TERBUTALINE SULFATE 1 MG/ML IJ SOLN
0.2500 mg | Freq: Once | INTRAMUSCULAR | Status: DC | PRN
  Filled 2016-06-18: qty 1

## 2016-06-18 MED ORDER — LACTATED RINGERS IV SOLN: 500.0000 mL | Freq: Once | INTRAVENOUS | Status: DC

## 2016-06-18 MED ORDER — PHENYLEPHRINE 40 MCG/ML (10ML) SYRINGE FOR IV PUSH (FOR BLOOD PRESSURE SUPPORT)
80.0000 ug | PREFILLED_SYRINGE | INTRAVENOUS | Status: DC | PRN
  Filled 2016-06-18: qty 5

## 2016-06-18 MED ORDER — SOD CITRATE-CITRIC ACID 500-334 MG/5ML PO SOLN: 30.0000 mL | ORAL | Status: DC | PRN

## 2016-06-18 MED ORDER — ACETAMINOPHEN 325 MG PO TABS
650.0000 mg | ORAL_TABLET | ORAL | Status: DC | PRN
Start: 2016-06-18 — End: 2016-06-20

## 2016-06-18 MED ORDER — DIBUCAINE 1 % RE OINT: 1.0000 "application " | TOPICAL_OINTMENT | RECTAL | Status: DC | PRN

## 2016-06-18 MED ORDER — OXYCODONE HCL 5 MG PO TABS: 10.0000 mg | ORAL_TABLET | ORAL | Status: DC | PRN

## 2016-06-18 MED ORDER — OXYCODONE-ACETAMINOPHEN 5-325 MG PO TABS: 1.0000 | ORAL_TABLET | ORAL | Status: DC | PRN

## 2016-06-18 MED ORDER — SENNOSIDES-DOCUSATE SODIUM 8.6-50 MG PO TABS
2.0000 | ORAL_TABLET | ORAL | Status: DC
  Administered 2016-06-19: 2 via ORAL
  Filled 2016-06-18 (×2): qty 2

## 2016-06-18 MED ORDER — LIDOCAINE HCL (PF) 1 % IJ SOLN
30.0000 mL | INTRAMUSCULAR | Status: DC | PRN
  Filled 2016-06-18: qty 30

## 2016-06-18 MED ORDER — IBUPROFEN 600 MG PO TABS
600.0000 mg | ORAL_TABLET | Freq: Four times a day (QID) | ORAL | Status: DC
  Administered 2016-06-18 – 2016-06-20 (×7): 600 mg via ORAL
  Filled 2016-06-18 (×7): qty 1

## 2016-06-18 MED ORDER — BENZOCAINE-MENTHOL 20-0.5 % EX AERO: 1.0000 "application " | INHALATION_SPRAY | CUTANEOUS | Status: DC | PRN

## 2016-06-18 NOTE — Anesthesia Pain Management Evaluation Note (Signed)
  CRNA Pain Management Visit Note  Patient: Jordan Fowler, 25 y.o., female  "Hello I am a [facility] of the anesthesia team at [facility]. We have an anesthesia team available at all times to provide care throughout the hospital, including epidural management and anesthesia for C-section. I don't know your plan for the delivery whether it a natural birth, water birth, IV sedation, nitrous supplementation, doula or epidural, but we want to meet your pain goals."   1.Was your pain managed to your expectations on prior hospitalizations?   Yes   2.What is your expectation for pain management during this hospitalization?     IV pain meds  3.How can we help you reach that goal?Standby  Record the patient's initial score and the patient's pain goal.   Pain: 8  Pain Goal: 8 [facility] wants you to be able to say your pain was always managed very well.  Jahmeer Porche 06/18/2016

## 2016-06-18 NOTE — Anesthesia Preprocedure Evaluation (Signed)
Anesthesia Evaluation  Patient identified by MRN, date of birth, ID band Patient awake    Reviewed: Allergy & Precautions, NPO status , Patient's Chart, lab work & pertinent test results  Airway Mallampati: II  TM Distance: >3 FB Neck ROM: Full    Dental no notable dental hx.    Pulmonary neg pulmonary ROS, former smoker,    Pulmonary exam normal breath sounds clear to auscultation       Cardiovascular negative cardio ROS Normal cardiovascular exam Rhythm:Regular Rate:Normal     Neuro/Psych negative neurological ROS  negative psych ROS   GI/Hepatic negative GI ROS, Neg liver ROS,   Endo/Other  negative endocrine ROS  Renal/GU negative Renal ROS  negative genitourinary   Musculoskeletal negative musculoskeletal ROS (+)   Abdominal   Peds negative pediatric ROS (+)  Hematology negative hematology ROS (+)   Anesthesia Other Findings   Reproductive/Obstetrics negative OB ROS                             Anesthesia Physical Anesthesia Plan  ASA: II  Anesthesia Plan: Epidural   Post-op Pain Management:    Induction:   Airway Management Planned: Natural Airway  Additional Equipment:   Intra-op Plan:   Post-operative Plan:   Informed Consent: I have reviewed the patients History and Physical, chart, labs and discussed the procedure including the risks, benefits and alternatives for the proposed anesthesia with the patient or authorized representative who has indicated his/her understanding and acceptance.   Dental advisory given  Plan Discussed with: CRNA  Anesthesia Plan Comments: (Informed consent obtained prior to proceeding including risk of failure, 1% risk of PDPH, risk of minor discomfort and bruising.  Discussed rare but serious complications including epidural abscess, permanent nerve injury, epidural hematoma.  Discussed alternatives to epidural analgesia and patient  desires to proceed.  Timeout performed pre-procedure verifying patient name, procedure, and platelet count.  Patient tolerated procedure well.)        Anesthesia Quick Evaluation

## 2016-06-18 NOTE — Progress Notes (Signed)
Patient ID: Jordan Fowler, female   DOB: Jun 23, 1991, 25 y.o.   MRN: [id number]  Getting comfortable with epidural, some pressure  AFVSS gen NAD FHTs 120's good var, category 1 toco q 3min  AROM for moderate meconium, w/o difficulty or complication  Continue IOL with pitocin, expect SVD

## 2016-06-18 NOTE — Anesthesia Procedure Notes (Signed)
Epidural Patient location during procedure: OB  Staffing Anesthesiologist: Sherrian DiversENENNY, Cindra Austad  Preanesthetic Checklist Completed: patient identified, site marked, surgical consent, pre-op evaluation, timeout performed, IV checked, risks and benefits discussed and monitors and equipment checked  Epidural Patient position: sitting Prep: DuraPrep Patient monitoring: blood pressure and heart rate Approach: midline Location: L3-L4 Injection technique: LOR saline  Needle:  Needle type: Tuohy  Needle gauge: 17 G Needle length: 9 cm Needle insertion depth: 7 cm Catheter type: closed end flexible Catheter size: 19 Gauge Catheter at skin depth: 13 cm Test dose: negative and Other  Assessment Events: blood not aspirated, injection not painful, no injection resistance, negative IV test and no paresthesia  Additional Notes Reason for block:procedure for pain

## 2016-06-19 LAB — CBC
HCT: 28.4 % — ABNORMAL LOW (ref 36.0–46.0)
Hemoglobin: 9.5 g/dL — ABNORMAL LOW (ref 12.0–15.0)
MCH: 31.4 pg (ref 26.0–34.0)
MCHC: 33.5 g/dL (ref 30.0–36.0)
MCV: 93.7 fL (ref 78.0–100.0)
PLATELETS: 247 10*3/uL (ref 150–400)
RBC: 3.03 MIL/uL — ABNORMAL LOW (ref 3.87–5.11)
RDW: 13.9 % (ref 11.5–15.5)
WBC: 19.4 10*3/uL — AB (ref 4.0–10.5)

## 2016-06-19 NOTE — Anesthesia Postprocedure Evaluation (Signed)
Anesthesia Post Note  Patient: Jordan Fowler  Procedure(s) Performed: * No procedures listed *  Patient location during evaluation: Mother Baby Anesthesia Type: Epidural Level of consciousness: awake, awake and alert and oriented Pain management: pain level controlled Vital Signs Assessment: post-procedure vital signs reviewed and stable Respiratory status: spontaneous breathing, nonlabored ventilation and respiratory function stable Cardiovascular status: stable Postop Assessment: no headache, no backache, no signs of nausea or vomiting, adequate PO intake and patient able to bend at knees Anesthetic complications: no     Last Vitals:  Vitals:   06/19/16 0550 06/19/16 0840  BP: (!) 87/55 (!) 112/59  Pulse: 73 64  Resp: 20 16  Temp: 36.7 C 36.7 C    Last Pain:  Vitals:   06/19/16 0840  TempSrc: Oral  PainSc: 0-No pain   Pain Goal:                 Raj Landress

## 2016-06-19 NOTE — Progress Notes (Signed)
UR chart review completed.  

## 2016-06-19 NOTE — Progress Notes (Signed)
PPD #1 No problems Afeb, VSS Fundus firm, NT at U-1 Continue routine postpartum care 

## 2016-06-20 MED ORDER — IBUPROFEN 600 MG PO TABS: 600.0000 mg | ORAL_TABLET | Freq: Four times a day (QID) | ORAL | 0 refills | Status: DC | PRN

## 2016-06-20 NOTE — Progress Notes (Signed)
Post Partum Day 2 Subjective: no complaints, up ad lib, voiding, tolerating PO, + flatus and ready for discharge to home. Bottlefeeding. Undecided on bc. Plans for circ in office  Objective: Blood pressure (!) 96/56, pulse 73, temperature 97.7 F (36.5 C), temperature source Oral, resp. rate 16, height 5\' 4"  (1.626 m), weight 173 lb (78.5 kg), last menstrual period 09/13/2015, SpO2 100 %, unknown if currently breastfeeding.  Physical Exam:  General: alert, cooperative and no distress Lochia: appropriate Uterine Fundus: firm Incision: n/a DVT Evaluation: No evidence of DVT seen on physical exam. No significant calf/ankle edema.   Recent Labs  06/18/16 0755 06/19/16 0635  HGB 10.8* 9.5*  HCT 32.3* 28.4*    Assessment/Plan: Discharge home and Contraception undecided. Instructiosn reviewed. Rx for ibuprofen given   LOS: 2 days   Jordan Fowler 06/20/2016, 8:53 AM

## 2016-06-20 NOTE — Discharge Summary (Signed)
OB Discharge Summary     Patient Name: Jordan Fowler DOB: 05-16-1991 MRN: [id number]  Date of admission: 06/18/2016 Delivering MD: Sherian Rein   Date of discharge: 06/20/2016  Admitting diagnosis: INDUCTION Intrauterine pregnancy: [redacted]w[redacted]d     Secondary diagnosis:  Principal Problem:   SVD (spontaneous vaginal delivery) Active Problems:   Limited prenatal care in third trimester  Additional problems: none     Discharge diagnosis: Term Pregnancy Delivered                                                                                                Post partum procedures:none  Augmentation: AROM and Pitocin  Complications: None  Hospital course:  Induction of Labor With Vaginal Delivery   25 y.o. yo [id number] at [redacted]w[redacted]d was admitted to the hospital 06/18/2016 for induction of labor.  Indication for induction: Favorable cervix at term.  Patient had an uncomplicated labor course as follows: Membrane Rupture Time/Date: 12:29 PM ,06/18/2016   Intrapartum Procedures: Episiotomy: None [1]                                         Lacerations:  Labial [10]  Patient had delivery of a Viable infant.  Information for the patient's newborn:  Kellsie, Grindle [[id number]]  Delivery Method: Vag-Spont   06/18/2016  Details of delivery can be found in separate delivery note.  Patient had a routine postpartum course. Patient is discharged home 06/20/16.   Physical exam Vitals:   06/19/16 0550 06/19/16 0840 06/19/16 1832 06/20/16 0613  BP: (!) 87/55 (!) 112/59 (!) 102/49 (!) 96/56  Pulse: 73 64 99 73  Resp: 20 16 18 16   Temp: 98 F (36.7 C) 98 F (36.7 C) 97.5 F (36.4 C) 97.7 F (36.5 C)  TempSrc:  Oral Oral Oral  SpO2:  100%    Weight:      Height:       General: alert, cooperative and no distress Lochia: appropriate Uterine Fundus: firm Incision: N/A DVT Evaluation: No evidence of DVT seen on physical exam. No significant calf/ankle edema. Labs: Lab Results  Component  Value Date   WBC 19.4 (H) 06/19/2016   HGB 9.5 (L) 06/19/2016   HCT 28.4 (L) 06/19/2016   MCV 93.7 06/19/2016   PLT 247 06/19/2016   CMP Latest Ref Rng & Units 03/05/2016  Glucose 65 - 99 mg/dL 94  BUN 6 - 20 mg/dL 8  Creatinine 1.30 - 8.65 mg/dL 7.84  Sodium 696 - 295 mmol/L 135  Potassium 3.5 - 5.1 mmol/L 3.8  Chloride 101 - 111 mmol/L 105  CO2 22 - 32 mmol/L 24  Calcium 8.9 - 10.3 mg/dL 2.8(U)  Total Protein 6.5 - 8.1 g/dL 6.7  Total Bilirubin 0.3 - 1.2 mg/dL 0.4  Alkaline Phos 38 - 126 U/L 52  AST 15 - 41 U/L 16  ALT 14 - 54 U/L 11(L)    Discharge instruction: per After Visit Summary and "Baby and Me Booklet".  After visit meds:  Medication List    TAKE these medications   acetaminophen 500 MG tablet Commonly known as:  TYLENOL Take 500 mg by mouth every 6 (six) hours as needed for mild pain or headache.   ibuprofen 600 MG tablet Commonly known as:  ADVIL,MOTRIN Take 1 tablet (600 mg total) by mouth every 6 (six) hours as needed for headache, moderate pain or cramping.       Diet: routine diet  Activity: Advance as tolerated. Pelvic rest for 6 weeks.   Outpatient follow up:6 weeks Follow up Appt:No future appointments. Follow up Visit:No Follow-up on file.  Postpartum contraception: Undecided  Newborn Data: Live born female  Birth Weight: 7 lb 13.4 oz (3555 g) APGAR: 9, 9  Baby Feeding: Bottle Disposition:home with mother   06/20/2016 [facility] Center[facility]ecilia Worema BertramBanga, DO

## 2016-06-20 NOTE — Discharge Instructions (Signed)
Nothing in vagina for 6 weeks.  No sex, tampons, and douching.  Other instructions as in [facility] Discharge Booklet. °

## 2017-02-14 ENCOUNTER — Encounter (HOSPITAL_COMMUNITY): Payer: Self-pay | Admitting: *Deleted

## 2017-02-14 ENCOUNTER — Inpatient Hospital Stay (HOSPITAL_COMMUNITY)
Admission: AD | Admit: 2017-02-14 | Discharge: 2017-02-14 | Disposition: A | Discharge status: Home / Self Care | Payer: Medicaid Other | Source: Ambulatory Visit | Attending: Family Medicine | Admitting: Family Medicine

## 2017-02-14 DIAGNOSIS — Z3202 Encounter for pregnancy test, result negative: Secondary | ICD-10-CM | POA: Diagnosis not present

## 2017-02-14 DIAGNOSIS — R109 Unspecified abdominal pain: Secondary | ICD-10-CM | POA: Diagnosis present

## 2017-02-14 DIAGNOSIS — R1031 Right lower quadrant pain: Secondary | ICD-10-CM

## 2017-02-14 DIAGNOSIS — F17200 Nicotine dependence, unspecified, uncomplicated: Secondary | ICD-10-CM | POA: Diagnosis not present

## 2017-02-14 DIAGNOSIS — R110 Nausea: Secondary | ICD-10-CM | POA: Diagnosis present

## 2017-02-14 DIAGNOSIS — N911 Secondary amenorrhea: Secondary | ICD-10-CM | POA: Diagnosis not present

## 2017-02-14 DIAGNOSIS — R1032 Left lower quadrant pain: Secondary | ICD-10-CM | POA: Insufficient documentation

## 2017-02-14 LAB — URINALYSIS, ROUTINE W REFLEX MICROSCOPIC
Bilirubin Urine: NEGATIVE
Glucose, UA: NEGATIVE mg/dL
Hgb urine dipstick: NEGATIVE
KETONES UR: 5 mg/dL — AB
LEUKOCYTES UA: NEGATIVE
NITRITE: NEGATIVE
PH: 6 (ref 5.0–8.0)
Protein, ur: NEGATIVE mg/dL
SPECIFIC GRAVITY, URINE: 1.027 (ref 1.005–1.030)

## 2017-02-14 LAB — POCT PREGNANCY, URINE: PREG TEST UR: NEGATIVE

## 2017-02-14 NOTE — MAU Provider Note (Signed)
Chief Complaint: Abdominal Cramping; Nausea; and Possible Pregnancy   First Provider Initiated Contact with Patient 02/14/17 0416     SUBJECTIVE HPI: Jordan Fowler is a 26 y.o. [id number] who presents to Maternity Admissions reporting being 16 late for period, mild low abd cramping and pregnancy Sx. Three neg home UPTs.   Location: suprapubic Quality: cramping Severity: 2/10 on pain scale Duration: few days Context: secondary amenorrhea Timing: intermittent Modifying factors: none, hasn't tried anything Associated signs and symptoms: Neg for VB, vaginal discharge, urinary complaints, fever, chills, vomiting, diarrhea, constipation. Pos for nausea, gagging.   Past Medical History:  Diagnosis Date  . Chlamydia 2008  . Late prenatal care affecting pregnancy in second trimester   . Normal pregnancy 08/17/2012  . SVD (spontaneous vaginal delivery) 08/17/2012  . SVD (spontaneous vaginal delivery) 06/18/2016   OB History  Gravida Para Term Preterm AB Living  SAB TAB Ectopic Multiple Live Births  0 2   0 2    # Outcome Date GA Lbr Len/2nd Weight Sex Delivery Anes PTL Lv  4 Term 06/18/16 [redacted]w[redacted]d 12:12 / 00:24 7 lb 13.4 oz (3.555 kg) M Vag-Spont EPI  LIV  3 TAB 2016          2 TAB 2014          1 Term 08/17/12 [redacted]w[redacted]d 16:12 / 00:24 6 lb 10.7 oz (3.025 kg) F Vag-Spont Local  LIV     Past Surgical History:  Procedure Laterality Date  . NO PAST SURGERIES     Social History   Social History  . Marital status: Single    Spouse name: N/A  . Number of children: N/A  . Years of education: N/A   Occupational History  . Not on file.   Social History Main Topics  . Smoking status: Current Every Day Smoker    Last attempt to quit: 10/20/2015  . Smokeless tobacco: Never Used  . Alcohol use No  . Drug use: No  . Sexual activity: Yes    Birth control/ protection: None   Other Topics Concern  . Not on file   Social History Narrative  . No narrative on file   Family  History  Problem Relation Age of Onset  . Diabetes Maternal Grandmother   . Diabetes Paternal Grandmother    No current facility-administered medications on file prior to encounter.    Current Outpatient Prescriptions on File Prior to Encounter  Medication Sig Dispense Refill  . acetaminophen (TYLENOL) 500 MG tablet Take 500 mg by mouth every 6 (six) hours as needed for mild pain or headache.    . ibuprofen (ADVIL,MOTRIN) 600 MG tablet Take 1 tablet (600 mg total) by mouth every 6 (six) hours as needed for headache, moderate pain or cramping. 30 tablet 0   No Known Allergies  I have reviewed patient's Past Medical Hx, Surgical Hx, Family Hx, Social Hx, medications and allergies.   Review of Systems  Constitutional: Negative for appetite change, chills and fever.  Gastrointestinal: Positive for abdominal pain and nausea. Negative for abdominal distention, blood in stool, constipation, diarrhea and vomiting.  Genitourinary: Negative for dysuria, frequency, hematuria, vaginal bleeding and vaginal discharge.  Musculoskeletal: Negative for back pain.    OBJECTIVE Patient Vitals for the past 24 hrs:  Temp Resp Height Weight  02/14/17 0248 97.8 F (36.6 C) 18  (1.626 m) 176 lb (79.8 kg)   Constitutional: Well-developed, well-nourished female in no acute distress.  Cardiovascular: normal rate Respiratory: normal rate and effort.  GI: Abd soft, non-tender. Neurologic: Alert and oriented x 4.  GU: Declined  LAB RESULTS Results for orders placed or performed during the hospital encounter of 02/14/17 (from the past 24 hour(s))  Urinalysis, Routine w reflex microscopic     Status: Abnormal   Collection Time: 02/14/17  3:00 AM  Result Value Ref Range   Color, Urine YELLOW YELLOW   APPearance HAZY (A) CLEAR   Specific Gravity, Urine 1.027 1.005 - 1.030   pH 6.0 5.0 - 8.0   Glucose, UA NEGATIVE NEGATIVE mg/dL   Hgb urine dipstick NEGATIVE NEGATIVE   Bilirubin Urine NEGATIVE  NEGATIVE   Ketones, ur 5 (A) NEGATIVE mg/dL   Protein, ur NEGATIVE NEGATIVE mg/dL   Nitrite NEGATIVE NEGATIVE   Leukocytes, UA NEGATIVE NEGATIVE  Pregnancy, urine POC     Status: None   Collection Time: 02/14/17  3:20 AM  Result Value Ref Range   Preg Test, Ur NEGATIVE NEGATIVE    IMAGING No results found.  MAU COURSE Orders Placed This Encounter  Procedures  . Urinalysis, Routine w reflex microscopic  . Pregnancy, urine POC  . Discharge patient   MDM - Mild cramping w/ multiple neg home and MAU UPTs. DDx includes late period w/ dysmenorrhea, pregnancy too early to show pos UPT. She states she is in a mutually monogamous relationship and has no concern for STI/PID. Pt is non-toxic-appearing.   ASSESSMENT 1. Secondary amenorrhea   2. Pregnancy examination or test, negative result   3. Abdominal cramping, bilateral lower quadrant     PLAN Discharge home in stable condition. Abd pain precautions Follow-up Information    [facility] Follow up.   Why:  as needed Contact information: [address]  [address] [place] [address] [phone]        [facility] [place] MATERNITY ADMISSIONS Follow up.   Why:  in ob/gyn emergencies Contact information: [address] [id number] [place] [address] [phone]         Allergies as of 02/14/2017   No Known Allergies     Medication List    TAKE these medications   acetaminophen 500 MG tablet Commonly known as:  TYLENOL Take 500 mg by mouth every 6 (six) hours as needed for mild pain or headache.   ibuprofen 600 MG tablet Commonly known as:  ADVIL,MOTRIN Take 1 tablet (600 mg total) by mouth every 6 (six) hours as needed for headache, moderate pain or cramping.        [place], CNM 02/14/2017  4:19 AM

## 2017-02-14 NOTE — Progress Notes (Signed)
V. Smith CNM in earlier to discuss test results and d/c plan. Written and verbal d/c instructions given and understanding voiced 

## 2017-02-14 NOTE — Discharge Instructions (Signed)
Pregnancy Test Information °What is a pregnancy test? °A pregnancy test is used to detect the presence of human chorionic gonadotropin (hCG) in a sample of your urine or blood. hCG is a hormone produced by the cells of the placenta. The placenta is the organ that forms to nourish and support a developing baby. °This test requires a sample of either blood or urine. A pregnancy test determines whether you are pregnant or not. °How are pregnancy tests done? °Pregnancy tests are done using a home pregnancy test or having a blood or urine test done at your health care provider's office. °Home pregnancy tests require a urine sample. °· Most kits use a plastic testing device with a strip of paper that indicates whether there is hCG in your urine. °· Follow the test instructions very carefully. °· After you urinate on the test stick, markings will appear to let you know whether you are pregnant. °· For best results, use your first urine of the morning. That is when the concentration of hCG is highest. ° °Having a blood test to check for pregnancy requires a sample of blood drawn from a vein in your hand or arm. Your health care provider will send your sample to a lab for testing. Results of a pregnancy test will be positive or negative. °Is one type of pregnancy test better than another? °In some cases, a blood test will return a positive result even if a urine test was negative because blood tests are more sensitive. This means blood tests can detect hCG earlier than home pregnancy tests. °How accurate are home pregnancy tests? °Both types of pregnancy tests are very accurate. °· A blood test is about 98% accurate. °· When you are far enough along in your pregnancy and when used correctly, home pregnancy tests are equally accurate. ° °Can anything interfere with home pregnancy test results °It is possible for certain conditions to cause an inaccurate test result (false positive or false negative). °· A false positive is a  positive test result when you are not pregnant. This can happen if you: °? Are taking certain medicines, including anticonvulsants or tranquilizers. °? Have certain proteins in your blood. °· A false negative is a negative test result when you are pregnant. This can happen if you: °? Took the test before there was enough hCG to detect. A pregnancy test will not be positive in most women until 3-4 weeks after conception. °? Drank a lot of liquid before the test. Diluted urine samples can sometimes give an inaccurate result. °? Take certain medicines, such as water pills (diuretics) or some antihistamines. ° °What should I do if I have a positive pregnancy test? °If you have a positive pregnancy test, schedule an appointment with your health care provider. You might need additional testing to confirm the pregnancy. In the meantime, begin taking a prenatal vitamin, stop smoking, stop drinking alcohol, and do not use street drugs. °Talk to your health care provider about how to take care of yourself during your pregnancy. Ask about what to expect from the care you will need throughout pregnancy (prenatal care). °This information is not intended to replace advice given to you by your health care provider. Make sure you discuss any questions you have with your health care provider. °Document Released: 10/16/2003 Document Revised: 09/09/2016 Document Reviewed: 02/07/2014 °Elsevier Interactive Patient Education © 2017 Elsevier Inc. ° °

## 2017-02-14 NOTE — MAU Note (Signed)
I am 16days late for my period. Slight nausea and mild cramping. I have done 3 upts that are all negative. I have 2 kids and I think I am pregnant. Denies vag bleeding. Some watery, white d/c.

## 2017-10-06 ENCOUNTER — Other Ambulatory Visit: Payer: Self-pay

## 2017-10-06 ENCOUNTER — Emergency Department (HOSPITAL_COMMUNITY)
Admission: EM | Admit: 2017-10-06 | Discharge: 2017-10-07 | Payer: Self-pay | Attending: Emergency Medicine | Admitting: Emergency Medicine

## 2017-10-06 ENCOUNTER — Emergency Department (HOSPITAL_COMMUNITY): Payer: Self-pay

## 2017-10-06 ENCOUNTER — Encounter (HOSPITAL_COMMUNITY): Payer: Self-pay | Admitting: Emergency Medicine

## 2017-10-06 DIAGNOSIS — R51 Headache: Secondary | ICD-10-CM | POA: Insufficient documentation

## 2017-10-06 DIAGNOSIS — Y999 Unspecified external cause status: Secondary | ICD-10-CM | POA: Insufficient documentation

## 2017-10-06 DIAGNOSIS — S1091XA Abrasion of unspecified part of neck, initial encounter: Secondary | ICD-10-CM | POA: Insufficient documentation

## 2017-10-06 DIAGNOSIS — F32A Depression, unspecified: Secondary | ICD-10-CM

## 2017-10-06 DIAGNOSIS — Y929 Unspecified place or not applicable: Secondary | ICD-10-CM | POA: Insufficient documentation

## 2017-10-06 DIAGNOSIS — F419 Anxiety disorder, unspecified: Secondary | ICD-10-CM

## 2017-10-06 DIAGNOSIS — Z0389 Encounter for observation for other suspected diseases and conditions ruled out: Secondary | ICD-10-CM | POA: Insufficient documentation

## 2017-10-06 DIAGNOSIS — T07XXXA Unspecified multiple injuries, initial encounter: Secondary | ICD-10-CM

## 2017-10-06 DIAGNOSIS — Y939 Activity, unspecified: Secondary | ICD-10-CM | POA: Insufficient documentation

## 2017-10-06 DIAGNOSIS — F329 Major depressive disorder, single episode, unspecified: Secondary | ICD-10-CM

## 2017-10-06 DIAGNOSIS — S0083XA Contusion of other part of head, initial encounter: Secondary | ICD-10-CM | POA: Insufficient documentation

## 2017-10-06 DIAGNOSIS — F17210 Nicotine dependence, cigarettes, uncomplicated: Secondary | ICD-10-CM | POA: Insufficient documentation

## 2017-10-06 LAB — RAPID URINE DRUG SCREEN, HOSP PERFORMED
Amphetamines: NOT DETECTED
BARBITURATES: NOT DETECTED
Benzodiazepines: NOT DETECTED
COCAINE: NOT DETECTED
OPIATES: NOT DETECTED
Tetrahydrocannabinol: POSITIVE — AB

## 2017-10-06 LAB — CBC WITH DIFFERENTIAL/PLATELET
BASOS ABS: 0 10*3/uL (ref 0.0–0.1)
Basophils Relative: 0 %
Eosinophils Absolute: 0 10*3/uL (ref 0.0–0.7)
Eosinophils Relative: 0 %
HEMATOCRIT: 37.2 % (ref 36.0–46.0)
HEMOGLOBIN: 12.5 g/dL (ref 12.0–15.0)
LYMPHS PCT: 16 %
Lymphs Abs: 1.5 10*3/uL (ref 0.7–4.0)
MCH: 32.5 pg (ref 26.0–34.0)
MCHC: 33.6 g/dL (ref 30.0–36.0)
MCV: 96.6 fL (ref 78.0–100.0)
MONO ABS: 0.3 10*3/uL (ref 0.1–1.0)
Monocytes Relative: 3 %
NEUTROS ABS: 7.7 10*3/uL (ref 1.7–7.7)
NEUTROS PCT: 81 %
PLATELETS: 319 10*3/uL (ref 150–400)
RBC: 3.85 MIL/uL — ABNORMAL LOW (ref 3.87–5.11)
RDW: 13.2 % (ref 11.5–15.5)
WBC: 9.6 10*3/uL (ref 4.0–10.5)

## 2017-10-06 LAB — ETHANOL: Alcohol, Ethyl (B): <10 mg/dL (ref <10)

## 2017-10-06 LAB — COMPREHENSIVE METABOLIC PANEL
ALK PHOS: 41 U/L (ref 38–126)
ALT: 15 U/L (ref 14–54)
ANION GAP: 9 (ref 5–15)
AST: 18 U/L (ref 15–41)
Albumin: 4.1 g/dL (ref 3.5–5.0)
BILIRUBIN TOTAL: 0.3 mg/dL (ref 0.3–1.2)
BUN: 12 mg/dL (ref 6–20)
CALCIUM: 9.1 mg/dL (ref 8.9–10.3)
CO2: 22 mmol/L (ref 22–32)
CREATININE: 0.65 mg/dL (ref 0.44–1.00)
Chloride: 108 mmol/L (ref 101–111)
GFR calc non Af Amer: >60 mL/min (ref >60)
GLUCOSE: 101 mg/dL — AB (ref 65–99)
Potassium: 4.1 mmol/L (ref 3.5–5.1)
Sodium: 139 mmol/L (ref 135–145)
TOTAL PROTEIN: 7.2 g/dL (ref 6.5–8.1)

## 2017-10-06 LAB — POC URINE PREG, ED: Preg Test, Ur: NEGATIVE

## 2017-10-06 MED ORDER — ONDANSETRON HCL 4 MG PO TABS: 4.0000 mg | ORAL_TABLET | Freq: Three times a day (TID) | ORAL | Status: DC | PRN

## 2017-10-06 MED ORDER — ZOLPIDEM TARTRATE 5 MG PO TABS: 5.0000 mg | ORAL_TABLET | Freq: Every evening | ORAL | Status: DC | PRN

## 2017-10-06 MED ORDER — ALUM & MAG HYDROXIDE-SIMETH 200-200-20 MG/5ML PO SUSP: 30.0000 mL | Freq: Four times a day (QID) | ORAL | Status: DC | PRN

## 2017-10-06 MED ORDER — ONDANSETRON 4 MG PO TBDP
4.0000 mg | ORAL_TABLET | Freq: Once | ORAL | Status: AC
  Administered 2017-10-06: 4 mg via ORAL
  Filled 2017-10-06: qty 1

## 2017-10-06 MED ORDER — IBUPROFEN 200 MG PO TABS: 600.0000 mg | ORAL_TABLET | Freq: Three times a day (TID) | ORAL | Status: DC | PRN

## 2017-10-06 MED ORDER — NICOTINE 21 MG/24HR TD PT24: 21.0000 mg | MEDICATED_PATCH | Freq: Every day | TRANSDERMAL | Status: DC
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
## 2017-10-07 NOTE — ED Notes (Addendum)
Attempted to discharge pt, she became irate and began cussing.

## 2017-10-07 NOTE — Progress Notes (Signed)
TTS received a phone call from [place], [place] to inquire if TTS would be coming back to speak with the pt again. TTS advised Misty StanleyLisa, RN the assessment has been completed and the report has been given to the EDP and to [place]ily, Charity fundraiserN. Misty StanleyLisa, RN states the pt was under the impression TTS would come out to speak with her again. Misty StanleyLisa, RN advised TTS did not inform the pt she would speak with her again, however the pt was informed that TTS would consult with the provide and the EDP who will make a recommendation for disposition and the pt's nurse will be contacted in regards to the recommendation which took place at 23:45 on 10/06/17 when TTS contacted the pt's nurse Irving BurtonEmily, RN and the EDP Dr. Blinda LeatherwoodPollina, MD.   Princess BruinsAquicha Elienai Gailey, MSW, LCSW Therapeutic Triage Specialist  [phone]

## 2017-12-21 ENCOUNTER — Ambulatory Visit (HOSPITAL_COMMUNITY)
Admission: EM | Admit: 2017-12-21 | Discharge: 2017-12-21 | Disposition: A | Discharge status: Home / Self Care | Payer: Self-pay | Attending: Family Medicine | Admitting: Family Medicine

## 2017-12-21 ENCOUNTER — Encounter (HOSPITAL_COMMUNITY): Payer: Self-pay | Admitting: Emergency Medicine

## 2017-12-21 DIAGNOSIS — K0889 Other specified disorders of teeth and supporting structures: Secondary | ICD-10-CM

## 2017-12-21 MED ORDER — AMOXICILLIN-POT CLAVULANATE 875-125 MG PO TABS: 1.0000 | ORAL_TABLET | Freq: Two times a day (BID) | ORAL | 0 refills | Status: AC

## 2017-12-21 MED ORDER — IBUPROFEN 800 MG PO TABS: 800.0000 mg | ORAL_TABLET | Freq: Three times a day (TID) | ORAL | 0 refills | Status: DC

## 2017-12-21 MED ORDER — HYDROCODONE-ACETAMINOPHEN 5-325 MG PO TABS
1.0000 | ORAL_TABLET | Freq: Once | ORAL | Status: AC
  Administered 2017-12-21: 1 via ORAL

## 2017-12-21 MED ORDER — HYDROCODONE-ACETAMINOPHEN 5-325 MG PO TABS
ORAL_TABLET | ORAL | Status: AC
  Filled 2017-12-21: qty 1

## 2017-12-21 NOTE — Discharge Instructions (Signed)
Please start antibiotics. Ibuprofen for pain control, ice to jaw.  Please follow up with dentist for definitive treatment.

## 2017-12-21 NOTE — ED Triage Notes (Signed)
PT reports severe dental pain over right upper jaw.

## 2017-12-21 NOTE — ED Provider Notes (Signed)
[facility]    CSN: [id number] Arrival date & time: 12/21/17  1709     History   Chief Complaint Chief Complaint  Patient presents with  . Dental Pain    HPI Jordan Fowler is a 27 y.o. female.   Yamilette presents with complaints of right upper molar and jaw pain which has worsened over the past two days. She states she "shattered" the tooth years ago but has not had it repaired. Without fevers. Denies any drainage or taste of discharge. Denies any previous. Has been using oragel and took tylenol tylenol this mornign which did not help. Does not follow with a dentist. Mild right face swelling.    ROS per HPI.       Past Medical History:  Diagnosis Date  . Chlamydia 2008  . Late prenatal care affecting pregnancy in second trimester   . Normal pregnancy 08/17/2012  . SVD (spontaneous vaginal delivery) 08/17/2012  . SVD (spontaneous vaginal delivery) 06/18/2016    Patient Active Problem List   Diagnosis Date Noted  . SVD (spontaneous vaginal delivery) 06/18/2016  . Limited prenatal care in third trimester 06/16/2016    Past Surgical History:  Procedure Laterality Date  . NO PAST SURGERIES      OB History    Gravida Para Term Preterm AB Living   4 2 2   2 2    SAB TAB Ectopic Multiple Live Births   0 2   0 2       Home Medications    Prior to Admission medications   Medication Sig Start Date End Date Taking? Authorizing Provider  acetaminophen (TYLENOL) 500 MG tablet Take 500 mg by mouth every 6 (six) hours as needed for mild pain or headache.   Yes [provider]  amoxicillin-clavulanate (AUGMENTIN) 875-125 MG tablet Take 1 tablet by mouth every 12 (twelve) hours for 10 days. 12/21/17 12/31/17  Georgetta Haber, NP  ibuprofen (ADVIL,MOTRIN) 800 MG tablet Take 1 tablet (800 mg total) by mouth 3 (three) times daily. 12/21/17   Georgetta Haber, NP    Family History Family History  Problem Relation Age of Onset  . Diabetes Maternal Grandmother    . Diabetes Paternal Grandmother     Social History Social History   Tobacco Use  . Smoking status: Current Every Day Smoker    Packs/day: 0.50    Last attempt to quit: 10/20/2015    Years since quitting: 2.1  . Smokeless tobacco: Never Used  Substance Use Topics  . Alcohol use: No  . Drug use: No     Allergies   Patient has no known allergies.   Review of Systems Review of Systems   Physical Exam Triage Vital Signs ED Triage Vitals [12/21/17 1809]  Enc Vitals Group     BP 116/84     Pulse Rate 83     Resp 16     Temp 98.4 F (36.9 C)     Temp Source Oral     SpO2 100 %     Weight 130 lb (59 kg)     Height      Head Circumference      Peak Flow      Pain Score 9     Pain Loc      Pain Edu?      Excl. in GC?    No data found.  Updated Vital Signs BP 116/84   Pulse 83   Temp 98.4 F (36.9 C) (Oral)  Resp 16   Wt 130 lb (59 kg)   LMP 12/18/2017   SpO2 100%   Breastfeeding? No   BMI 22.31 kg/m   Visual Acuity Right Eye Distance:   Left Eye Distance:   Bilateral Distance:    Right Eye Near:   Left Eye Near:    Bilateral Near:     Physical Exam  Constitutional: She is oriented to person, place, and time. She appears well-developed and well-nourished. No distress.  HENT:  Head: Normocephalic and atraumatic.  Right Ear: Tympanic membrane and ear canal normal.  Left Ear: Tympanic membrane and ear canal normal.  Nose: Nose normal.  Mouth/Throat: Oropharynx is clear and moist and mucous membranes are normal. Abnormal dentition. Dental caries present.    Tenderness to right upper jaw on palpation; minimal swelling  Cardiovascular: Normal rate, regular rhythm and normal heart sounds.  Pulmonary/Chest: Effort normal and breath sounds normal.  Neurological: She is alert and oriented to person, place, and time.  Skin: Skin is warm and dry.  Psychiatric:  Tearful in pain      UC Treatments / Results  Labs (all labs ordered are listed, but  only abnormal results are displayed) Labs Reviewed - No data to display  EKG  EKG Interpretation None       Radiology No results found.  Procedures Procedures (including critical care time)  Medications Ordered in UC Medications  HYDROcodone-acetaminophen (NORCO/VICODIN) 5-325 MG per tablet 1 tablet (not administered)     Initial Impression / Assessment and Plan / UC Course  I have reviewed the triage vital signs and the nursing notes.  Pertinent labs & imaging results that were available during my care of the patient were reviewed by me and considered in my medical decision making (see chart for details).     Antibiotics initiated. Hydrocodone given in clinic, ibuprofen 800mg  tabs prescribed for pain control. Dental resource provided, to follow up for definitive treatment. Patient verbalized understanding and agreeable to plan.    Final Clinical Impressions(s) / UC Diagnoses   Final diagnoses:  Pain, dental    ED Discharge Orders        Ordered    ibuprofen (ADVIL,MOTRIN) 800 MG tablet  3 times daily     12/21/17 1847    amoxicillin-clavulanate (AUGMENTIN) 875-125 MG tablet  Every 12 hours     12/21/17 1847       Controlled Substance Prescriptions [place] Controlled Substance Registry consulted? Not Applicable   Georgetta HaberBurky, Karma Ansley B, NP 12/21/17 [phone]

## 2018-04-19 ENCOUNTER — Ambulatory Visit (HOSPITAL_COMMUNITY)
Admission: EM | Admit: 2018-04-19 | Discharge: 2018-04-19 | Disposition: A | Discharge status: Home / Self Care | Payer: Self-pay | Attending: Family Medicine | Admitting: Family Medicine

## 2018-04-19 ENCOUNTER — Encounter (HOSPITAL_COMMUNITY): Payer: Self-pay | Admitting: Emergency Medicine

## 2018-04-19 DIAGNOSIS — M62838 Other muscle spasm: Secondary | ICD-10-CM

## 2018-04-19 MED ORDER — CYCLOBENZAPRINE HCL 10 MG PO TABS: 10.0000 mg | ORAL_TABLET | Freq: Two times a day (BID) | ORAL | 0 refills | Status: DC | PRN

## 2018-04-19 MED ORDER — PREDNISONE 10 MG PO TABS: 20.0000 mg | ORAL_TABLET | Freq: Two times a day (BID) | ORAL | 0 refills | Status: AC

## 2018-04-19 NOTE — Discharge Instructions (Addendum)
Continue conservative management of rest, ice, and gentle stretches Prescribed prednisone as directed and to completion Take cyclobenzaprine at nighttime for symptomatic relief. Avoid driving or operating heavy machinery while using medication. Follow up with PCP if symptoms persist Return or go to the ER if you have any new or worsening symptoms (fever, chills, chest pain, abdominal pain, changes in bowel or bladder habits, pain radiating into lower legs, etc...)

## 2018-04-19 NOTE — ED Triage Notes (Signed)
PT reports left shoulder burning sensation and right shoulder "tightness" for 4 days. Worse when laying down

## 2018-04-19 NOTE — ED Provider Notes (Signed)
[facility]   [id number] 04/19/18 Arrival Time: 1813  SUBJECTIVE: History from: patient. Jordan Fowler is a 27 y.o. female complains of right shoulder pain that began 4 days ago.  Denies a precipitating event or specific injury, but admits to repetitive activities at work including .  Localizes the pain to the underneath right shoulder blade.  Describes the right shoulder pain as intermittent and burning.  Has tried prescription strength ibuprofen with temporary relief.  Symptoms are made worse with laying down.  Denies similar symptoms in the past.   Patient also report left sided neck pain that began yesterday without precipitating event or injury.  Localizes the pain to the left side of neck and trapezius.  States the pain is constant and achy in character.  Worse with neck ROM.  Has tried ibuprofen 800mg  and tylenol without relief.    Denies fever, chills, erythema, ecchymosis, effusion, weakness, numbness and tingling.      ROS: As per HPI.  Past Medical History:  Diagnosis Date  . Chlamydia 2008  . Late prenatal care affecting pregnancy in second trimester   . Normal pregnancy 08/17/2012  . SVD (spontaneous vaginal delivery) 08/17/2012  . SVD (spontaneous vaginal delivery) 06/18/2016   Past Surgical History:  Procedure Laterality Date  . NO PAST SURGERIES     No Known Allergies No current facility-administered medications on file prior to encounter.    Current Outpatient Medications on File Prior to Encounter  Medication Sig Dispense Refill  . ibuprofen (ADVIL,MOTRIN) 800 MG tablet Take 1 tablet (800 mg total) by mouth 3 (three) times daily. 21 tablet 0  . acetaminophen (TYLENOL) 500 MG tablet Take 500 mg by mouth every 6 (six) hours as needed for mild pain or headache.     Social History   Socioeconomic History  . Marital status: Single    Spouse name: Not on file  . Number of children: Not on file  . Years of education: Not on file  . Highest education level:  Not on file  Occupational History  . Not on file  Social Needs  . Financial resource strain: Not on file  . Food insecurity:    Worry: Not on file    Inability: Not on file  . Transportation needs:    Medical: Not on file    Non-medical: Not on file  Tobacco Use  . Smoking status: Current Every Day Smoker    Packs/day: 0.50    Last attempt to quit: 10/20/2015    Years since quitting: 2.4  . Smokeless tobacco: Never Used  Substance and Sexual Activity  . Alcohol use: No  . Drug use: No  . Sexual activity: Yes    Birth control/protection: None  Lifestyle  . Physical activity:    Days per week: Not on file    Minutes per session: Not on file  . Stress: Not on file  Relationships  . Social connections:    Talks on phone: Not on file    Gets together: Not on file    Attends religious service: Not on file    Active member of club or organization: Not on file    Attends meetings of clubs or organizations: Not on file    Relationship status: Not on file  . Intimate partner violence:    Fear of current or ex partner: Not on file    Emotionally abused: Not on file    Physically abused: Not on file    Forced sexual activity: Not  on file  Other Topics Concern  . Not on file  Social History Narrative  . Not on file   Family History  Problem Relation Age of Onset  . Diabetes Maternal Grandmother   . Diabetes Paternal Grandmother     OBJECTIVE:  Vitals:   04/19/18 1841 04/19/18 1842  BP: 107/68   Pulse: 74   Resp: 16   Temp: 99.2 F (37.3 C)   TempSrc: Oral   SpO2: 98%   Weight:  130 lb (59 kg)    General appearance: AOx3; in no acute distress.  Head: NCAT Lungs: CTA bilaterally Heart: RRR.  Clear S1 and S2 without murmur, gallops, or rubs.  Radial pulses 2+ bilaterally. Musculoskeletal: Bilateral shoulders Inspection: Skin warm, dry, clear and intact without obvious erythema, effusion, or ecchymosis.  Palpation: Tender to palpation about the just medially to  the right medial border of the scapula with palpable muscle spasm; tender about the superior edge of the left trapezius ROM: FROM active and passive Strength: 5/5 shld abduction, 5/5 shld adduction, 5/5 elbow flexion, 5/5 elbow extension, 5/5 grip strength Skin: warm and dry Neurologic: Ambulates without difficulty; Sensation intact about the upper extremities Psychological: alert and cooperative; anxious affect ASSESSMENT & PLAN:  1. Muscle spasm   2. Trapezius muscle spasm     @NIMG @  Meds ordered this encounter  Medications  . predniSONE (DELTASONE) 10 MG tablet    Sig: Take 2 tablets (20 mg total) by mouth 2 (two) times daily with a meal for 5 days.    Dispense:  20 tablet    Refill:  0    Order Specific Question:   Supervising Provider    Answer:   Jordan Fowler [id number]  . cyclobenzaprine (FLEXERIL) 10 MG tablet    Sig: Take 1 tablet (10 mg total) by mouth 2 (two) times daily as needed for muscle spasms.    Dispense:  20 tablet    Refill:  0    Order Specific Question:   Supervising Provider    Answer:   Jordan Fowler [[id number]]    Continue conservative management of rest, ice, and gentle stretches Prescribed prednisone as directed and to completion Take cyclobenzaprine at nighttime for symptomatic relief. Avoid driving or operating heavy machinery while using medication. Follow up with PCP if symptoms persist Return or go to the ER if you have any new or worsening symptoms (fever, chills, chest pain, abdominal pain, changes in bowel or bladder habits, pain radiating into lower legs, etc...)   Reviewed expectations re: course of current medical issues. Questions answered. Outlined signs and symptoms indicating need for more acute intervention. Patient verbalized understanding. After Visit Summary given.    Jordan Harding, PA-C 04/19/18 [phone]

## 2018-05-04 ENCOUNTER — Ambulatory Visit (INDEPENDENT_AMBULATORY_CARE_PROVIDER_SITE_OTHER): Payer: Self-pay

## 2018-05-04 ENCOUNTER — Encounter (HOSPITAL_COMMUNITY): Payer: Self-pay | Admitting: Emergency Medicine

## 2018-05-04 ENCOUNTER — Ambulatory Visit (HOSPITAL_COMMUNITY)
Admission: EM | Admit: 2018-05-04 | Discharge: 2018-05-04 | Disposition: A | Discharge status: Home / Self Care | Payer: Self-pay | Attending: Family Medicine | Admitting: Family Medicine

## 2018-05-04 DIAGNOSIS — S43401A Unspecified sprain of right shoulder joint, initial encounter: Secondary | ICD-10-CM

## 2018-05-04 DIAGNOSIS — M25511 Pain in right shoulder: Secondary | ICD-10-CM

## 2018-05-04 NOTE — ED Provider Notes (Signed)
[facility]    CSN: [id number] Arrival date & time: 05/04/18  1810     History   Chief Complaint Chief Complaint  Patient presents with  . Shoulder Pain    HPI Jordan Fowler is a 27 y.o. female no contributing past medical history presenting today for an of right shoulder injury.  Patient states that she was roughhousing with her children last night and injured her shoulder.  She is concerned about dislocation.  She is unsure exactly of how the injury happened.  She has had worsening pain today.  Denies numbness or tingling.  Denies previous issues with her shoulder, but does state that she has recently been treated for a muscle spasm of the right shoulder.  HPI  Past Medical History:  Diagnosis Date  . Chlamydia 2008  . Late prenatal care affecting pregnancy in second trimester   . Normal pregnancy 08/17/2012  . SVD (spontaneous vaginal delivery) 08/17/2012  . SVD (spontaneous vaginal delivery) 06/18/2016    Patient Active Problem List   Diagnosis Date Noted  . SVD (spontaneous vaginal delivery) 06/18/2016  . Limited prenatal care in third trimester 06/16/2016    Past Surgical History:  Procedure Laterality Date  . NO PAST SURGERIES      OB History    Gravida  4   Para  2   Term  2   Preterm      AB  2   Living  2     SAB  0   TAB  2   Ectopic      Multiple  0   Live Births  2            Home Medications    Prior to Admission medications   Medication Sig Start Date End Date Taking? Authorizing Provider  acetaminophen (TYLENOL) 500 MG tablet Take 500 mg by mouth every 6 (six) hours as needed for mild pain or headache.    [provider]  cyclobenzaprine (FLEXERIL) 10 MG tablet Take 1 tablet (10 mg total) by mouth 2 (two) times daily as needed for muscle spasms. 04/19/18   Wurst, [place], PA-C  ibuprofen (ADVIL,MOTRIN) 800 MG tablet Take 1 tablet (800 mg total) by mouth 3 (three) times daily. 12/21/17   Georgetta HaberBurky, Natalie B, NP      Family History Family History  Problem Relation Age of Onset  . Diabetes Maternal Grandmother   . Diabetes Paternal Grandmother     Social History Social History   Tobacco Use  . Smoking status: Current Every Day Smoker    Packs/day: 0.50    Last attempt to quit: 10/20/2015    Years since quitting: 2.5  . Smokeless tobacco: Never Used  Substance Use Topics  . Alcohol use: No  . Drug use: No     Allergies   Patient has no known allergies.   Review of Systems Review of Systems  Constitutional: Negative for activity change, appetite change, fatigue and fever.  Respiratory: Negative for shortness of breath.   Cardiovascular: Negative for chest pain.  Gastrointestinal: Negative for abdominal pain, nausea and vomiting.  Musculoskeletal: Positive for arthralgias and myalgias. Negative for back pain, gait problem, neck pain and neck stiffness.  Skin: Negative for color change and wound.  Neurological: Negative for dizziness, weakness, light-headedness, numbness and headaches.     Physical Exam Triage Vital Signs ED Triage Vitals  Enc Vitals Group     BP 05/04/18 1826 114/76     Pulse Rate 05/04/18  1826 99     Resp 05/04/18 1826 18     Temp 05/04/18 1826 98.2 F (36.8 C)     Temp Source 05/04/18 1826 Oral     SpO2 05/04/18 1826 98 %     Weight --      Height --      Head Circumference --      Peak Flow --      Pain Score 05/04/18 1827 8     Pain Loc --      Pain Edu? --      Excl. in GC? --    No data found.  Updated Vital Signs BP 114/76 (BP Location: Left Arm)   Pulse 99   Temp 98.2 F (36.8 C) (Oral)   Resp 18   SpO2 98%   Visual Acuity Right Eye Distance:   Left Eye Distance:   Bilateral Distance:    Right Eye Near:   Left Eye Near:    Bilateral Near:     Physical Exam  Constitutional: She is oriented to person, place, and time. She appears well-developed and well-nourished.  No acute distress  HENT:  Head: Normocephalic and  atraumatic.  Nose: Nose normal.  Eyes: Conjunctivae are normal.  Neck: Neck supple.  Cardiovascular: Normal rate.  Pulmonary/Chest: Effort normal. No respiratory distress.  Abdominal: She exhibits no distension.  Musculoskeletal: Normal range of motion.  No obvious deformity or swelling overlying right shoulder, nontender to palpation over clavicle, tenderness to palpation over right AC joint as well as lateral shoulder area, shoulder abduction largely intact, extremely limited with forward flexion, resisting passive motion.  Neurological: She is alert and oriented to person, place, and time.  Skin: Skin is warm and dry.  Psychiatric: She has a normal mood and affect.  Nursing note and vitals reviewed.    UC Treatments / Results  Labs (all labs ordered are listed, but only abnormal results are displayed) Labs Reviewed - No data to display  EKG None  Radiology Dg Shoulder Right  Result Date: 05/04/2018 CLINICAL DATA:  Pain after trauma EXAM: RIGHT SHOULDER - 2+ VIEW COMPARISON:  None. FINDINGS: There is no evidence of fracture or dislocation. There is no evidence of arthropathy or other focal bone abnormality. Soft tissues are unremarkable. IMPRESSION: Negative. Electronically Signed   By: Gerome Sam III M.D   On: 05/04/2018 19:21    Procedures Procedures (including critical care time)  Medications Ordered in UC Medications - No data to display  Initial Impression / Assessment and Plan / UC Course  I have reviewed the triage vital signs and the nursing notes.  Pertinent labs & imaging results that were available during my care of the patient were reviewed by me and considered in my medical decision making (see chart for details).     Negative for fracture or dislocation.  Likely sprain of shoulder, special test for rotator cuff tendinopathy difficult to assess due to patient's pain and resistance of moving her arm.  Will provide sling for comfort.  Recommended  anti-inflammatories, ice as well as provided shoulder exercises to work on range of motion as pain is subsiding.  Follow-up if symptoms not improving as expected in the next 1 to 2 weeks.Discussed strict return precautions. Patient verbalized understanding and is agreeable with plan.  Final Clinical Impressions(s) / UC Diagnoses   Final diagnoses:  Sprain of right shoulder, unspecified shoulder sprain type, initial encounter     Discharge Instructions     Use anti-inflammatories for  pain/swelling. You may take up to 800 mg Ibuprofen every 8 hours with food. You may supplement Ibuprofen with Tylenol [phone] mg every 8 hours.   Ice shoulder  Use sling for comfort, work on exercises as pain decreasing  Follow up if symptoms not improving in 1-2 weeks    ED Prescriptions    None     Controlled Substance Prescriptions [place] Controlled Substance Registry consulted? Not Applicable   Lew Dawes, [place] 05/04/18 2054

## 2018-05-04 NOTE — Discharge Instructions (Signed)
Use anti-inflammatories for pain/swelling. You may take up to 800 mg Ibuprofen every 8 hours with food. You may supplement Ibuprofen with Tylenol [phone] mg every 8 hours.   Ice shoulder  Use sling for comfort, work on exercises as pain decreasing  Follow up if symptoms not improving in 1-2 weeks

## 2018-05-04 NOTE — ED Triage Notes (Signed)
Pt sts right shoulder pain after injuring while playing with children

## 2018-06-25 ENCOUNTER — Encounter (HOSPITAL_COMMUNITY): Payer: Self-pay | Admitting: *Deleted

## 2018-06-25 ENCOUNTER — Other Ambulatory Visit: Payer: Self-pay

## 2018-06-25 ENCOUNTER — Inpatient Hospital Stay (HOSPITAL_COMMUNITY)
Admission: AD | Admit: 2018-06-25 | Discharge: 2018-06-25 | Disposition: A | Discharge status: Home / Self Care | Payer: Medicaid Other | Source: Ambulatory Visit | Attending: Obstetrics and Gynecology | Admitting: Obstetrics and Gynecology

## 2018-06-25 ENCOUNTER — Inpatient Hospital Stay (HOSPITAL_COMMUNITY): Payer: Medicaid Other

## 2018-06-25 DIAGNOSIS — O26891 Other specified pregnancy related conditions, first trimester: Secondary | ICD-10-CM

## 2018-06-25 DIAGNOSIS — O99331 Smoking (tobacco) complicating pregnancy, first trimester: Secondary | ICD-10-CM | POA: Diagnosis not present

## 2018-06-25 DIAGNOSIS — R109 Unspecified abdominal pain: Secondary | ICD-10-CM | POA: Diagnosis not present

## 2018-06-25 DIAGNOSIS — O283 Abnormal ultrasonic finding on antenatal screening of mother: Secondary | ICD-10-CM

## 2018-06-25 DIAGNOSIS — N760 Acute vaginitis: Secondary | ICD-10-CM

## 2018-06-25 DIAGNOSIS — O23591 Infection of other part of genital tract in pregnancy, first trimester: Secondary | ICD-10-CM | POA: Diagnosis not present

## 2018-06-25 DIAGNOSIS — O3680X0 Pregnancy with inconclusive fetal viability, not applicable or unspecified: Secondary | ICD-10-CM

## 2018-06-25 DIAGNOSIS — Z3A12 12 weeks gestation of pregnancy: Secondary | ICD-10-CM | POA: Insufficient documentation

## 2018-06-25 DIAGNOSIS — B9689 Other specified bacterial agents as the cause of diseases classified elsewhere: Secondary | ICD-10-CM | POA: Insufficient documentation

## 2018-06-25 LAB — WET PREP, GENITAL
Sperm: NONE SEEN
TRICH WET PREP: NONE SEEN
Yeast Wet Prep HPF POC: NONE SEEN

## 2018-06-25 LAB — CBC
HCT: 35.4 % — ABNORMAL LOW (ref 36.0–46.0)
HEMOGLOBIN: 11.7 g/dL — AB (ref 12.0–15.0)
MCH: 32.3 pg (ref 26.0–34.0)
MCHC: 33.1 g/dL (ref 30.0–36.0)
MCV: 97.8 fL (ref 78.0–100.0)
Platelets: 286 10*3/uL (ref 150–400)
RBC: 3.62 MIL/uL — ABNORMAL LOW (ref 3.87–5.11)
RDW: 13.7 % (ref 11.5–15.5)
WBC: 5.8 10*3/uL (ref 4.0–10.5)

## 2018-06-25 LAB — URINALYSIS, ROUTINE W REFLEX MICROSCOPIC
BILIRUBIN URINE: NEGATIVE
GLUCOSE, UA: NEGATIVE mg/dL
HGB URINE DIPSTICK: NEGATIVE
Ketones, ur: NEGATIVE mg/dL
Leukocytes, UA: NEGATIVE
Nitrite: NEGATIVE
PH: 6 (ref 5.0–8.0)
Protein, ur: NEGATIVE mg/dL
SPECIFIC GRAVITY, URINE: 1.023 (ref 1.005–1.030)

## 2018-06-25 LAB — HCG, QUANTITATIVE, PREGNANCY: HCG, BETA CHAIN, QUANT, S: 2806 m[IU]/mL — AB (ref <5)

## 2018-06-25 LAB — POCT PREGNANCY, URINE: Preg Test, Ur: POSITIVE — AB

## 2018-06-25 MED ORDER — METRONIDAZOLE 500 MG PO TABS: 500.0000 mg | ORAL_TABLET | Freq: Two times a day (BID) | ORAL | 0 refills | Status: DC

## 2018-06-25 NOTE — MAU Provider Note (Signed)
Chief Complaint: Abdominal Pain   First Provider Initiated Contact with Patient 06/25/18 [phone]      SUBJECTIVE HPI: Jordan Fowler is a 27 y.o. [id number] at [redacted]w[redacted]d by LMP who presents to maternity admissions reporting mild abdominal cramping with negative pregnancy tests and missed menses. She reports LMP was 2 months ago approximately and she is usually regular.  She took pregnancy tests at home, last one 3 weeks ago, and these were negative.  She has not tried anything for her low intermittent menstrual like cramping pain. It is unchanged.  There are no associated symptoms.   She denies vaginal bleeding, vaginal itching/burning, urinary symptoms, h/a, dizziness, n/v, or fever/chills.     HPI  Past Medical History:  Diagnosis Date  . Chlamydia 2008  . Late prenatal care affecting pregnancy in second trimester   . Normal pregnancy 08/17/2012  . SVD (spontaneous vaginal delivery) 08/17/2012  . SVD (spontaneous vaginal delivery) 06/18/2016   Past Surgical History:  Procedure Laterality Date  . NO PAST SURGERIES     Social History   Socioeconomic History  . Marital status: Single    Spouse name: Not on file  . Number of children: Not on file  . Years of education: Not on file  . Highest education level: Not on file  Occupational History  . Not on file  Social Needs  . Financial resource strain: Not on file  . Food insecurity:    Worry: Not on file    Inability: Not on file  . Transportation needs:    Medical: Not on file    Non-medical: Not on file  Tobacco Use  . Smoking status: Current Every Day Smoker    Packs/day: 0.50    Last attempt to quit: 10/20/2015    Years since quitting: 2.6  . Smokeless tobacco: Never Used  Substance and Sexual Activity  . Alcohol use: No  . Drug use: No  . Sexual activity: Yes    Birth control/protection: None  Lifestyle  . Physical activity:    Days per week: Not on file    Minutes per session: Not on file  . Stress: Not on file   Relationships  . Social connections:    Talks on phone: Not on file    Gets together: Not on file    Attends religious service: Not on file    Active member of club or organization: Not on file    Attends meetings of clubs or organizations: Not on file    Relationship status: Not on file  . Intimate partner violence:    Fear of current or ex partner: Not on file    Emotionally abused: Not on file    Physically abused: Not on file    Forced sexual activity: Not on file  Other Topics Concern  . Not on file  Social History Narrative  . Not on file   No current facility-administered medications on file prior to encounter.    Current Outpatient Medications on File Prior to Encounter  Medication Sig Dispense Refill  . ibuprofen (ADVIL,MOTRIN) 200 MG tablet Take 400 mg by mouth every 6 (six) hours as needed for headache.    Marland Kitchen acetaminophen (TYLENOL) 500 MG tablet Take 500 mg by mouth every 6 (six) hours as needed for mild pain or headache.    . cyclobenzaprine (FLEXERIL) 10 MG tablet Take 1 tablet (10 mg total) by mouth 2 (two) times daily as needed for muscle spasms. (Patient not taking: Reported on 06/25/2018) 20  tablet 0  . ibuprofen (ADVIL,MOTRIN) 800 MG tablet Take 1 tablet (800 mg total) by mouth 3 (three) times daily. (Patient not taking: Reported on 06/25/2018) 21 tablet 0   No Known Allergies  ROS:  Review of Systems  Constitutional: Negative for chills, fatigue and fever.  Respiratory: Negative for shortness of breath.   Cardiovascular: Negative for chest pain.  Gastrointestinal: Positive for abdominal pain. Negative for nausea and vomiting.  Genitourinary: Positive for pelvic pain. Negative for difficulty urinating, dysuria, flank pain, vaginal bleeding, vaginal discharge and vaginal pain.  Neurological: Negative for dizziness and headaches.  Psychiatric/Behavioral: Negative.      I have reviewed patient's Past Medical Hx, Surgical Hx, Family Hx, Social Hx, medications  and allergies.   Physical Exam   Patient Vitals for the past 24 hrs:  BP Temp Temp src Pulse Resp SpO2 Height Weight  06/25/18 0850 105/64 98.4 F (36.9 C) Oral 95 16 100 % 5\' 4"  (1.626 m) 63.6 kg  06/25/18 0833 - - - - - - - 63.6 kg   Constitutional: Well-developed, well-nourished female in no acute distress.  Cardiovascular: normal rate Respiratory: normal effort GI: Abd soft, non-tender. Pos BS x 4 MS: Extremities nontender, no edema, normal ROM Neurologic: Alert and oriented x 4.  GU: Neg CVAT.  PELVIC EXAM: Cervix pink, visually closed, without lesion, scant white creamy discharge, vaginal walls and external genitalia normal Bimanual exam: Cervix 0/long/high, firm, anterior, neg CMT, uterus nontender, nonenlarged, adnexa without tenderness, enlargement, or mass   LAB RESULTS Results for orders placed or performed during the hospital encounter of 06/25/18 (from the past 24 hour(s))  Urinalysis, Routine w reflex microscopic     Status: Abnormal   Collection Time: 06/25/18  8:40 AM  Result Value Ref Range   Color, Urine YELLOW YELLOW   APPearance HAZY (A) CLEAR   Specific Gravity, Urine 1.023 1.005 - 1.030   pH 6.0 5.0 - 8.0   Glucose, UA NEGATIVE NEGATIVE mg/dL   Hgb urine dipstick NEGATIVE NEGATIVE   Bilirubin Urine NEGATIVE NEGATIVE   Ketones, ur NEGATIVE NEGATIVE mg/dL   Protein, ur NEGATIVE NEGATIVE mg/dL   Nitrite NEGATIVE NEGATIVE   Leukocytes, UA NEGATIVE NEGATIVE  Pregnancy, urine POC     Status: Abnormal   Collection Time: 06/25/18  8:41 AM  Result Value Ref Range   Preg Test, Ur POSITIVE (A) NEGATIVE  Wet prep, genital     Status: Abnormal   Collection Time: 06/25/18  9:49 AM  Result Value Ref Range   Yeast Wet Prep HPF POC NONE SEEN NONE SEEN   Trich, Wet Prep NONE SEEN NONE SEEN   Clue Cells Wet Prep HPF POC PRESENT (A) NONE SEEN   WBC, Wet Prep HPF POC MANY (A) NONE SEEN   Sperm NONE SEEN   CBC     Status: Abnormal   Collection Time: 06/25/18  9:54  AM  Result Value Ref Range   WBC 5.8 4.0 - 10.5 K/uL   RBC 3.62 (L) 3.87 - 5.11 MIL/uL   Hemoglobin 11.7 (L) 12.0 - 15.0 g/dL   HCT 13.035.4 (L) 86.536.0 - 78.446.0 %   MCV 97.8 78.0 - 100.0 fL   MCH 32.3 26.0 - 34.0 pg   MCHC 33.1 30.0 - 36.0 g/dL   RDW 69.613.7 29.511.5 - 28.415.5 %   Platelets 286 150 - 400 K/uL  hCG, quantitative, pregnancy     Status: Abnormal   Collection Time: 06/25/18  9:54 AM  Result Value Ref Range  hCG, Beta Chain, Quant, S 2,806 (H) <5 mIU/mL       IMAGING US Ob Comp Less 14 Wks  Result Date: 06/25/2018 CLINICAL DATA:  Pregnant, abdominal pain EXAM: OBSTETRIC <14 WK ULTRASOUND TECHNIQUE: Transabdominal ultrasound was performed for evaluation of the gestation as well as the maternal uterus and adnexal regions. COMPARISON:  None. FINDINGS: Intrauterine gestational sac: Single Yolk sac:  Not Visualized. Embryo:  Not Visualized. Cardiac Activity: Not Visualized. MSD: 3.9 mm   5 w   0 d Subchorionic hemorrhage:  None visualized. Maternal uterus/adnexae: Bilateral ovaries are within normal limits. No free fluid. IMPRESSION: Single intrauterine gestational sac, measuring 5 weeks 0 days by mean sac diameter. No yolk sac or fetal pole. Consider follow-up pelvic ultrasound in 14 days to confirm viability, as clinically warranted. Electronically Signed   By: Charline Bills M.D.   On: 06/25/2018 11:54    MAU Management/MDM: Ordered labs and [place] and reviewed results.  Findings today could represent a normal early pregnancy, spontaneous abortion or ectopic pregnancy which can be life-threatening.  Ectopic precautions were given to the patient with plan to return in 48 hours for repeat quant hcg to evaluate pregnancy development. Clinical exam and wet prep c/w bacterial vaginosis. Rx for Flagyl 500 mg BID x 7 days.  Pt discharged with strict ectopic precautions.  ASSESSMENT 1. Pregnancy of unknown anatomic location   2. Abdominal pain during pregnancy, first trimester   3. Bacterial vaginosis      PLAN Discharge home  Allergies as of 06/25/2018   No Known Allergies     Medication List    STOP taking these medications   cyclobenzaprine 10 MG tablet Commonly known as:  FLEXERIL   ibuprofen 200 MG tablet Commonly known as:  ADVIL,MOTRIN   ibuprofen 800 MG tablet Commonly known as:  ADVIL,MOTRIN     TAKE these medications   acetaminophen 500 MG tablet Commonly known as:  TYLENOL Take 500 mg by mouth every 6 (six) hours as needed for mild pain or headache.   metroNIDAZOLE 500 MG tablet Commonly known as:  FLAGYL Take 1 tablet (500 mg total) by mouth 2 (two) times daily.      Follow-up Information    [facility] [place] Follow up.   Why:  Return to MAU on Sunday, 06/27/18, after 11 am for repeat labs. Return sooner as needed for emergencies.  Contact information: [address] [place] [phone]          Sharen Counter Certified Nurse-Midwife 06/25/2018  12:05 PM

## 2018-06-25 NOTE — MAU Note (Signed)
No period for a couple months, ? Maybe early June. No positive home preg tests.  No bleeding or leaking. Cramping started 3-4 or days ago, is worse at night.

## 2018-06-26 LAB — HIV ANTIBODY (ROUTINE TESTING W REFLEX): HIV Screen 4th Generation wRfx: NONREACTIVE

## 2018-06-29 LAB — GC/CHLAMYDIA PROBE AMP (~~LOC~~) NOT AT ARMC
CHLAMYDIA, DNA PROBE: NEGATIVE
NEISSERIA GONORRHEA: NEGATIVE

## 2018-07-28 ENCOUNTER — Encounter (HOSPITAL_COMMUNITY): Payer: Self-pay | Admitting: *Deleted

## 2018-07-28 ENCOUNTER — Inpatient Hospital Stay (HOSPITAL_COMMUNITY): Payer: Medicaid Other

## 2018-07-28 ENCOUNTER — Other Ambulatory Visit: Payer: Self-pay

## 2018-07-28 ENCOUNTER — Inpatient Hospital Stay (HOSPITAL_COMMUNITY)
Admission: AD | Admit: 2018-07-28 | Discharge: 2018-07-28 | Disposition: A | Discharge status: Home / Self Care | Payer: Medicaid Other | Source: Ambulatory Visit | Attending: Obstetrics and Gynecology | Admitting: Obstetrics and Gynecology

## 2018-07-28 DIAGNOSIS — Z3A09 9 weeks gestation of pregnancy: Secondary | ICD-10-CM | POA: Diagnosis not present

## 2018-07-28 DIAGNOSIS — O3680X0 Pregnancy with inconclusive fetal viability, not applicable or unspecified: Secondary | ICD-10-CM

## 2018-07-28 DIAGNOSIS — F17210 Nicotine dependence, cigarettes, uncomplicated: Secondary | ICD-10-CM | POA: Insufficient documentation

## 2018-07-28 DIAGNOSIS — O209 Hemorrhage in early pregnancy, unspecified: Secondary | ICD-10-CM | POA: Diagnosis present

## 2018-07-28 DIAGNOSIS — O99331 Smoking (tobacco) complicating pregnancy, first trimester: Secondary | ICD-10-CM | POA: Diagnosis not present

## 2018-07-28 LAB — URINALYSIS, ROUTINE W REFLEX MICROSCOPIC

## 2018-07-28 LAB — CBC WITH DIFFERENTIAL/PLATELET
Basophils Absolute: 0 10*3/uL (ref 0.0–0.1)
Basophils Relative: 0 %
EOS PCT: 4 %
Eosinophils Absolute: 0.3 10*3/uL (ref 0.0–0.7)
HCT: 31 % — ABNORMAL LOW (ref 36.0–46.0)
Hemoglobin: 10.4 g/dL — ABNORMAL LOW (ref 12.0–15.0)
LYMPHS ABS: 2.7 10*3/uL (ref 0.7–4.0)
LYMPHS PCT: 32 %
MCH: 33 pg (ref 26.0–34.0)
MCHC: 33.5 g/dL (ref 30.0–36.0)
MCV: 98.4 fL (ref 78.0–100.0)
MONO ABS: 0.3 10*3/uL (ref 0.1–1.0)
MONOS PCT: 4 %
Neutro Abs: 5.1 10*3/uL (ref 1.7–7.7)
Neutrophils Relative %: 60 %
PLATELETS: 278 10*3/uL (ref 150–400)
RBC: 3.15 MIL/uL — ABNORMAL LOW (ref 3.87–5.11)
RDW: 12.9 % (ref 11.5–15.5)
WBC: 8.5 10*3/uL (ref 4.0–10.5)

## 2018-07-28 LAB — URINALYSIS, MICROSCOPIC (REFLEX): RBC / HPF: >50 RBC/hpf (ref 0–5)

## 2018-07-28 LAB — HCG, QUANTITATIVE, PREGNANCY: hCG, Beta Chain, Quant, S: 2202 m[IU]/mL — ABNORMAL HIGH (ref <5)

## 2018-07-28 MED ORDER — KETOROLAC TROMETHAMINE 60 MG/2ML IM SOLN
60.0000 mg | Freq: Once | INTRAMUSCULAR | Status: DC
  Filled 2018-07-28: qty 2

## 2018-07-28 NOTE — Discharge Instructions (Signed)
Pelvic Rest °Pelvic rest may be recommended if: °· Your placenta is partially or completely covering the opening of your cervix (placenta previa). °· There is bleeding between the wall of the uterus and the amniotic sac in the first trimester of pregnancy (subchorionic hemorrhage). °· You went into labor too early (preterm labor). ° °Based on your overall health and the health of your baby, your health care provider will decide if pelvic rest is right for you. °How do I rest my pelvis? °For as long as told by your health care provider: °· Do not have sex, sexual stimulation, or an orgasm. °· Do not use tampons. Do not douche. Do not put anything in your vagina. °· Do not lift anything that is heavier than 10 lb (4.5 kg). °· Avoid activities that take a lot of effort (are strenuous). °· Avoid any activity in which your pelvic muscles could become strained. ° °When should I seek medical care? °Seek medical care if you have: °· Cramping pain in your lower abdomen. °· Vaginal discharge. °· A low, dull backache. °· Regular contractions. °· Uterine tightening. ° °When should I seek immediate medical care? °Seek immediate medical care if: °· You have vaginal bleeding and you are pregnant. ° °This information is not intended to replace advice given to you by your health care provider. Make sure you discuss any questions you have with your health care provider. °Document Released: 02/07/2011 Document Revised: 03/20/2016 Document Reviewed: 04/16/2015 °[employer]vier Interactive Patient Education © 2018 [employer] ° °

## 2018-07-28 NOTE — MAU Provider Note (Signed)
History     CSN: [id number]  Arrival date and time: 07/28/18 1245   First Provider Initiated Contact with Patient 07/28/18 1408      No chief complaint on file.  HPI   Ms.Jordan Fowler is a 27 y.o. female [phone] @ [redacted]w[redacted]d here in MAU with complaints of heavy vaginal bleeding. Says she started bleeding on Wednesday. The bleeding became heavy today. Says her bleeding is heavier than a period. She is passing clots. Some mild lower abdominal pain, however not bad. No dizziness. Does not feel she needs pain medication at this time. Patient tearful. See's Dr. Ellyn Hack in the office.   OB History    Gravida  5   Para  2   Term  2   Preterm      AB  2   Living  2     SAB  0   TAB  2   Ectopic      Multiple  0   Live Births  2           Past Medical History:  Diagnosis Date  . Chlamydia 2008  . Late prenatal care affecting pregnancy in second trimester   . Normal pregnancy 08/17/2012  . SVD (spontaneous vaginal delivery) 08/17/2012  . SVD (spontaneous vaginal delivery) 06/18/2016    Past Surgical History:  Procedure Laterality Date  . NO PAST SURGERIES      Family History  Problem Relation Age of Onset  . Diabetes Maternal Grandmother   . Diabetes Paternal Grandmother     Social History   Tobacco Use  . Smoking status: Current Every Day Smoker    Packs/day: 0.50    Last attempt to quit: 10/20/2015    Years since quitting: 2.7  . Smokeless tobacco: Never Used  Substance Use Topics  . Alcohol use: No  . Drug use: No    Allergies: No Known Allergies  Medications Prior to Admission  Medication Sig Dispense Refill Last Dose  . acetaminophen (TYLENOL) 500 MG tablet Take 500 mg by mouth every 6 (six) hours as needed for mild pain or headache.   12/21/2017 at Unknown time  . metroNIDAZOLE (FLAGYL) 500 MG tablet Take 1 tablet (500 mg total) by mouth 2 (two) times daily. 14 tablet 0    Results for orders placed or performed during the hospital encounter of  07/28/18 (from the past 48 hour(s))  CBC with Differential/Platelet     Status: Abnormal   Collection Time: 07/28/18  1:17 PM  Result Value Ref Range   WBC 8.5 4.0 - 10.5 K/uL   RBC 3.15 (L) 3.87 - 5.11 MIL/uL   Hemoglobin 10.4 (L) 12.0 - 15.0 g/dL   HCT 14.7 (L) 82.9 - 56.2 %   MCV 98.4 78.0 - 100.0 fL   MCH 33.0 26.0 - 34.0 pg   MCHC 33.5 30.0 - 36.0 g/dL   RDW 13.0 86.5 - 78.4 %   Platelets 278 150 - 400 K/uL   Neutrophils Relative % 60 %   Neutro Abs 5.1 1.7 - 7.7 K/uL   Lymphocytes Relative 32 %   Lymphs Abs 2.7 0.7 - 4.0 K/uL   Monocytes Relative 4 %   Monocytes Absolute 0.3 0.1 - 1.0 K/uL   Eosinophils Relative 4 %   Eosinophils Absolute 0.3 0.0 - 0.7 K/uL   Basophils Relative 0 %   Basophils Absolute 0.0 0.0 - 0.1 K/uL    Comment: Performed at [facility], [address] [place], [place] [address]  hCG, quantitative, pregnancy     Status: Abnormal   Collection Time: 07/28/18  1:17 PM  Result Value Ref Range   hCG, Beta Chain, Quant, S 2,202 (H) <5 mIU/mL    Comment:          GEST. AGE      CONC.  (mIU/mL)   <=1 WEEK        5 - 50     2 WEEKS       50 - 500     3 WEEKS       100 - 10,000     4 WEEKS     1,000 - 30,000     5 WEEKS     3,500 - 115,000   6-8 WEEKS     12,000 - 270,000    12 WEEKS     15,000 - 220,000        FEMALE AND NON-PREGNANT FEMALE:     LESS THAN 5 mIU/mL Performed at [facility], [address], [place], [place] [address]   Urinalysis, Routine w reflex microscopic     Status: Abnormal   Collection Time: 07/28/18  1:22 PM  Result Value Ref Range   Color, Urine RED (A) YELLOW    Comment: BIOCHEMICALS MAY BE AFFECTED BY COLOR   APPearance TURBID (A) CLEAR   Specific Gravity, Urine  1.005 - 1.030    TEST NOT REPORTED DUE TO COLOR INTERFERENCE OF URINE PIGMENT   pH  5.0 - 8.0    TEST NOT REPORTED DUE TO COLOR INTERFERENCE OF URINE PIGMENT   Glucose, UA (A) NEGATIVE mg/dL    TEST NOT REPORTED DUE TO COLOR INTERFERENCE OF URINE  PIGMENT   Hgb urine dipstick (A) NEGATIVE    TEST NOT REPORTED DUE TO COLOR INTERFERENCE OF URINE PIGMENT   Bilirubin Urine (A) NEGATIVE    TEST NOT REPORTED DUE TO COLOR INTERFERENCE OF URINE PIGMENT   Ketones, ur (A) NEGATIVE mg/dL    TEST NOT REPORTED DUE TO COLOR INTERFERENCE OF URINE PIGMENT   Protein, ur (A) NEGATIVE mg/dL    TEST NOT REPORTED DUE TO COLOR INTERFERENCE OF URINE PIGMENT   Nitrite (A) NEGATIVE    TEST NOT REPORTED DUE TO COLOR INTERFERENCE OF URINE PIGMENT   Leukocytes, UA (A) NEGATIVE    TEST NOT REPORTED DUE TO COLOR INTERFERENCE OF URINE PIGMENT    Comment: Performed at [facility], [address], [place], [place] [address]  Urinalysis, Microscopic (reflex)     Status: Abnormal   Collection Time: 07/28/18  1:22 PM  Result Value Ref Range   RBC / HPF >50 0 - 5 RBC/hpf   WBC, UA 0-5 0 - 5 WBC/hpf   Bacteria, UA MANY (A) NONE SEEN   Squamous Epithelial / LPF 0-5 0 - 5   Urine-Other MICROSCOPIC EXAM PERFORMED ON UNCONCENTRATED URINE     Comment: Performed at [facility], [address], [place], [place] [address]   US Ob Transvaginal  Result Date: 07/28/2018 CLINICAL DATA:  Vaginal bleeding in first trimester of pregnancy, positive pregnancy test; no quantitative beta HCG for correlation EXAM: TRANSVAGINAL OB ULTRASOUND TECHNIQUE: Transvaginal ultrasound was performed for complete evaluation of the gestation as well as the maternal uterus, adnexal regions, and pelvic cul-de-sac. COMPARISON:  06/25/2018 FINDINGS: Intrauterine gestational sac: Present, single, containing nonspecific internal echogenicity, abnormal in position at the lower uterine segment/endocervical canal Yolk sac:  Not visualized Embryo:  Not visualized Cardiac Activity: N/A Heart Rate: N/A bpm MSD:  17.2 mm   6 w   4 d Subchorionic hemorrhage:  None visualized. Maternal uterus/adnexae: RIGHT ovary normal size and morphology, 4.8 x 2.6 x 2.2 cm. LEFT ovary normal size and morphology, 2.4 x 1.2  x 2.3 cm. Trace free pelvic fluid. No adnexal masses. IMPRESSION: A gestational sac containing debris but no identifiable yolk sac or fetal pole is identified at the lower uterine segment/endocervical canal. Findings are suspicious but not yet definitive for failed pregnancy question spontaneous abortion. Recommend follow-up US in 10-14 days for definitive diagnosis. This recommendation follows SRU consensus guidelines: Diagnostic Criteria for Nonviable Pregnancy Early in the First Trimester. Malva Limes Med 2013; 454:0981-19. Electronically Signed   By: Ulyses Southward M.D.   On: 07/28/2018 14:11   Review of Systems  Constitutional: Negative for fever.  Gastrointestinal: Positive for abdominal pain.  Genitourinary: Positive for vaginal bleeding.   Physical Exam   Blood pressure (!) 102/54, pulse 92, temperature 98 F (36.7 C), resp. rate 16, height 5\' 3"  (1.6 m), weight 67.1 kg, last menstrual period 03/29/2018.  Physical Exam  Constitutional: She is oriented to person, place, and time. She appears well-developed and well-nourished. No distress.  HENT:  Head: Normocephalic.  Eyes: Pupils are equal, round, and reactive to light.  Genitourinary:  Genitourinary Comments: Vagina - Large amount of bright red blood with multiple clots. 7-10 swabs used to clear vault of blood. gestational sac ?POC noted at cervical os. Ring forceps with gental extraction used. ? POC sent to pathology.   Bimanual exam: Cervix: 1 cm, anterior  Uterus non tender, enlarged  Chaperone present for exam.   Musculoskeletal: Normal range of motion.  Neurological: She is alert and oriented to person, place, and time.  Skin: Skin is warm. She is not diaphoretic.  Psychiatric: Her behavior is normal.   MAU Course  Procedures None  MDM  A positive blood type  HIV, CBC, Hcg, ABO US OB transvaginal  ? POC pulled from cervix with ring forceps Patient refused pain medication at this time.   Assessment and Plan   A:  1.  Pregnancy of unknown anatomic location   2. Vaginal bleeding in pregnancy, first trimester     P:  Discharge home with stable condition Return to MAU on Friday after 3:00 pm for repeat stat quant Bleeding precautions Pelvic rest ? POC to pathology.    Venia Carbon I, NP 07/28/2018 5:03 PM

## 2018-07-28 NOTE — MAU Note (Signed)
Pt presents to MAU with complaints of heavy vaginal bleeding since last night. + pregnancy test August the 30th. Lower abdominal cramping

## 2018-10-11 ENCOUNTER — Encounter (HOSPITAL_COMMUNITY): Payer: Self-pay | Admitting: Emergency Medicine

## 2018-10-11 ENCOUNTER — Other Ambulatory Visit: Payer: Self-pay

## 2018-10-11 ENCOUNTER — Ambulatory Visit (HOSPITAL_COMMUNITY)
Admission: EM | Admit: 2018-10-11 | Discharge: 2018-10-11 | Disposition: A | Discharge status: Home / Self Care | Payer: Medicaid Other | Attending: Urgent Care | Admitting: Urgent Care

## 2018-10-11 DIAGNOSIS — K0381 Cracked tooth: Secondary | ICD-10-CM | POA: Insufficient documentation

## 2018-10-11 DIAGNOSIS — K047 Periapical abscess without sinus: Secondary | ICD-10-CM | POA: Insufficient documentation

## 2018-10-11 DIAGNOSIS — K0889 Other specified disorders of teeth and supporting structures: Secondary | ICD-10-CM | POA: Insufficient documentation

## 2018-10-11 MED ORDER — AMOXICILLIN-POT CLAVULANATE 875-125 MG PO TABS: 1.0000 | ORAL_TABLET | Freq: Two times a day (BID) | ORAL | 0 refills | Status: DC
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
## 2018-10-11 NOTE — Discharge Instructions (Signed)
You may take 500mg Tylenol with ibuprofen 400-600mg every 6 hours for pain and inflammation. °

## 2018-10-11 NOTE — ED Triage Notes (Signed)
Per pt she has a bad troth on right side and has noticed tenderness and swelling

## 2018-10-27 NOTE — L&D Delivery Note (Signed)
DELIVERY NOTE  Pt complete and at +2 station with urge to push. Epidural controlling pain. Pt pushed and delivered a viable female infant in ROA position. NEO peds present. Anterior and posterior shoulders spontaneously delivered with next two pushes; body easily followed next. Infant placed on mothers abdomen and bulb suction of mouth and nose performed. Cord was then clamped and cut by MD. Cord blood obtained, 3VC. Baby had a vigorous spontaneous cry noted. Placenta then delivered at 0733 intact. Fundal massage performed and pitocin per protocol. Fundus firm. The following lacerations were noted: none.  Mother and baby stable. Counts correct   Infant time: 0728 Gender: female (no circ0 Placenta time: 0733 Apgars: 9/9 Weight: pending skin-to-skin

## 2019-02-08 ENCOUNTER — Ambulatory Visit (HOSPITAL_COMMUNITY)
Admission: EM | Admit: 2019-02-08 | Discharge: 2019-02-08 | Disposition: A | Discharge status: Home / Self Care | Payer: Medicaid Other | Attending: Family Medicine | Admitting: Family Medicine

## 2019-02-08 ENCOUNTER — Other Ambulatory Visit: Payer: Self-pay

## 2019-02-08 ENCOUNTER — Telehealth (HOSPITAL_COMMUNITY): Payer: Self-pay | Admitting: Emergency Medicine

## 2019-02-08 ENCOUNTER — Encounter (HOSPITAL_COMMUNITY): Payer: Self-pay | Admitting: Emergency Medicine

## 2019-02-08 DIAGNOSIS — R112 Nausea with vomiting, unspecified: Secondary | ICD-10-CM | POA: Diagnosis not present

## 2019-02-08 DIAGNOSIS — Z3490 Encounter for supervision of normal pregnancy, unspecified, unspecified trimester: Secondary | ICD-10-CM | POA: Diagnosis not present

## 2019-02-08 LAB — POCT URINALYSIS DIP (DEVICE)
Bilirubin Urine: NEGATIVE
Glucose, UA: 100 mg/dL — AB
Hgb urine dipstick: NEGATIVE
Ketones, ur: 40 mg/dL — AB
Leukocytes,Ua: NEGATIVE
Nitrite: NEGATIVE
Protein, ur: 30 mg/dL — AB
Specific Gravity, Urine: >=1.03 (ref 1.005–1.030)
Urobilinogen, UA: 0.2 mg/dL (ref 0.0–1.0)
pH: 6.5 (ref 5.0–8.0)

## 2019-02-08 LAB — POCT PREGNANCY, URINE: Preg Test, Ur: POSITIVE — AB

## 2019-02-08 MED ORDER — ONDANSETRON HCL 4 MG PO TABS: 4.0000 mg | ORAL_TABLET | Freq: Three times a day (TID) | ORAL | 0 refills | Status: DC | PRN

## 2019-02-08 NOTE — Discharge Instructions (Signed)
Follow up with womens health Nausea in pregnancy handout given

## 2019-02-08 NOTE — ED Triage Notes (Signed)
Pt here for vomiting worse today and "morning sickness" x 6 weeks; pt sts LMP was 11/07/18

## 2019-02-08 NOTE — ED Provider Notes (Signed)
[facility]    CSN: [id number] Arrival date & time: 02/08/19  1833     History   Chief Complaint Chief Complaint  Patient presents with  . Emesis  . Possible Pregnancy    HPI Jordan Fowler is a 28 y.o. female.   HPI  Patient is here for pregnancy and nausea.  She has 2 children at home.  Her youngest is 45 years old.  She stopped having periods in January.  She did a pregnancy test March 1.  It was positive.  She has not called her OB/GYN, or PCP for advice or an appointment.  She states she has been staying home because of COVID-19.  She states she has been having more nausea this pregnancy than her prior pregnancies.  For the last 6 weeks she has had nausea on a daily basis with intermittent vomiting.  Today she is having vomiting with anything she tries to eat.  She is drinking fluids well.  No dizziness or drowsiness.  No abdominal pain or bleeding.  Past Medical History:  Diagnosis Date  . Chlamydia 2008  . Late prenatal care affecting pregnancy in second trimester   . Normal pregnancy 08/17/2012  . SVD (spontaneous vaginal delivery) 08/17/2012  . SVD (spontaneous vaginal delivery) 06/18/2016    Patient Active Problem List   Diagnosis Date Noted  . SVD (spontaneous vaginal delivery) 06/18/2016  . Limited prenatal care in third trimester 06/16/2016    Past Surgical History:  Procedure Laterality Date  . NO PAST SURGERIES      OB History    Gravida  5   Para  2   Term  2   Preterm      AB  2   Living  2     SAB  0   TAB  2   Ectopic      Multiple  0   Live Births  2            Home Medications    Prior to Admission medications   Medication Sig Start Date End Date Taking? Authorizing Provider  acetaminophen (TYLENOL) 500 MG tablet Take 500 mg by mouth every 6 (six) hours as needed for mild pain or headache.    [provider]  ondansetron (ZOFRAN) 4 MG tablet Take 1 tablet (4 mg total) by mouth every 8 (eight) hours as  needed for nausea or vomiting. 02/08/19   Eustace Moore, MD    Family History Family History  Problem Relation Age of Onset  . Diabetes Maternal Grandmother   . Diabetes Paternal Grandmother     Social History Social History   Tobacco Use  . Smoking status: Current Every Day Smoker    Packs/day: 0.50    Last attempt to quit: 10/20/2015    Years since quitting: 3.3  . Smokeless tobacco: Never Used  Substance Use Topics  . Alcohol use: No  . Drug use: No     Allergies   Patient has no known allergies.   Review of Systems Review of Systems  Constitutional: Negative for chills, fever and unexpected weight change.  HENT: Negative for ear pain and sore throat.   Eyes: Negative for pain and visual disturbance.  Respiratory: Negative for cough and shortness of breath.   Cardiovascular: Negative for chest pain and palpitations.  Gastrointestinal: Positive for nausea and vomiting. Negative for abdominal pain.  Genitourinary: Negative for dysuria and hematuria.  Musculoskeletal: Negative for arthralgias and back pain.  Skin: Negative  for color change and rash.  Neurological: Negative for seizures and syncope.  All other systems reviewed and are negative.    Physical Exam Triage Vital Signs ED Triage Vitals [02/08/19 1857]  Enc Vitals Group     BP 125/64     Pulse Rate 88     Resp 18     Temp 98.2 F (36.8 C)     Temp Source Oral     SpO2 100 %   No data found.  Updated Vital Signs BP 125/64 (BP Location: Right Arm)   Pulse 88   Temp 98.2 F (36.8 C) (Oral)   Resp 18   LMP 11/07/2018   SpO2 100%      Physical Exam Constitutional:      General: She is not in acute distress.    Appearance: She is well-developed.  HENT:     Head: Normocephalic and atraumatic.     Mouth/Throat:     Mouth: Mucous membranes are moist.  Eyes:     Conjunctiva/sclera: Conjunctivae normal.     Pupils: Pupils are equal, round, and reactive to light.  Neck:      Musculoskeletal: Normal range of motion.  Cardiovascular:     Rate and Rhythm: Normal rate and regular rhythm.     Heart sounds: Normal heart sounds.  Pulmonary:     Effort: Pulmonary effort is normal. No respiratory distress.     Breath sounds: Normal breath sounds.  Abdominal:     General: There is no distension.     Palpations: Abdomen is soft.  Musculoskeletal: Normal range of motion.  Skin:    General: Skin is warm and dry.  Neurological:     Mental Status: She is alert.      UC Treatments / Results  Labs (all labs ordered are listed, but only abnormal results are displayed) Labs Reviewed  POCT URINALYSIS DIP (DEVICE) - Abnormal; Notable for the following components:      Result Value   Glucose, UA 100 (*)    Ketones, ur 40 (*)    Protein, ur 30 (*)    All other components within normal limits  POCT PREGNANCY, URINE - Abnormal; Notable for the following components:   Preg Test, Ur POSITIVE (*)    All other components within normal limits    EKG None  Radiology No results found.  Procedures Procedures (including critical care time)  Medications Ordered in UC Medications - No data to display  Initial Impression / Assessment and Plan / UC Course  I have reviewed the triage vital signs and the nursing notes.  Pertinent labs & imaging results that were available during my care of the patient were reviewed by me and considered in my medical decision making (see chart for details).     I confirmed the patient she is pregnant I emphasized to her the need to quit smoking I gave her a handout on morning sickness I recommend she call tomorrow to get additional advice and set up an appointment with OB/GYN I gave her a limited number (4) of Zofran to take for the vomiting.  Recommend she try over-the-counter products such as doxylamine and B6 Final Clinical Impressions(s) / UC Diagnoses   Final diagnoses:  Early stage of pregnancy     Discharge Instructions      Follow up with womens health Nausea in pregnancy handout given   ED Prescriptions    Medication Sig Dispense Auth. Provider   ondansetron (ZOFRAN) 4 MG tablet Take  1 tablet (4 mg total) by mouth every 8 (eight) hours as needed for nausea or vomiting. 4 tablet Eustace MooreNelson, Laneisha Mino Sue, MD     Controlled Substance Prescriptions [place] Controlled Substance Registry consulted? Not Applicable   Eustace MooreNelson, Delaine Hernandez Sue, MD 02/08/19 2009

## 2019-03-04 LAB — OB RESULTS CONSOLE GC/CHLAMYDIA
Chlamydia: NEGATIVE
Gonorrhea: NEGATIVE

## 2019-03-04 LAB — OB RESULTS CONSOLE HIV ANTIBODY (ROUTINE TESTING): HIV: NONREACTIVE

## 2019-03-04 LAB — OB RESULTS CONSOLE ABO/RH: RH Type: POSITIVE

## 2019-03-04 LAB — OB RESULTS CONSOLE RPR: RPR: NONREACTIVE

## 2019-03-04 LAB — OB RESULTS CONSOLE RUBELLA ANTIBODY, IGM: Rubella: IMMUNE

## 2019-03-04 LAB — OB RESULTS CONSOLE HEPATITIS B SURFACE ANTIGEN: Hepatitis B Surface Ag: NEGATIVE

## 2019-03-04 LAB — OB RESULTS CONSOLE ANTIBODY SCREEN: Antibody Screen: NEGATIVE

## 2019-05-20 ENCOUNTER — Encounter (HOSPITAL_COMMUNITY): Payer: Self-pay

## 2019-07-13 LAB — OB RESULTS CONSOLE GBS: GBS: NEGATIVE

## 2019-08-05 ENCOUNTER — Encounter (HOSPITAL_COMMUNITY): Payer: Self-pay | Admitting: *Deleted

## 2019-08-05 ENCOUNTER — Telehealth (HOSPITAL_COMMUNITY): Payer: Self-pay | Admitting: *Deleted

## 2019-08-05 NOTE — Telephone Encounter (Signed)
Preadmission screen  

## 2019-08-07 ENCOUNTER — Encounter (HOSPITAL_COMMUNITY): Payer: Self-pay

## 2019-08-07 ENCOUNTER — Other Ambulatory Visit: Payer: Self-pay

## 2019-08-07 ENCOUNTER — Inpatient Hospital Stay (HOSPITAL_COMMUNITY)
Admission: AD | Admit: 2019-08-07 | Discharge: 2019-08-09 | DRG: 807 | Disposition: A | Discharge status: Home / Self Care | Payer: Medicaid Other | Attending: Obstetrics and Gynecology | Admitting: Obstetrics and Gynecology

## 2019-08-07 DIAGNOSIS — Z20828 Contact with and (suspected) exposure to other viral communicable diseases: Secondary | ICD-10-CM | POA: Diagnosis present

## 2019-08-07 DIAGNOSIS — Z3A40 40 weeks gestation of pregnancy: Secondary | ICD-10-CM

## 2019-08-07 DIAGNOSIS — Z87891 Personal history of nicotine dependence: Secondary | ICD-10-CM

## 2019-08-07 DIAGNOSIS — Z3A39 39 weeks gestation of pregnancy: Secondary | ICD-10-CM

## 2019-08-07 NOTE — MAU Note (Signed)
Pt reports contractions that started yesterday but have gradually worsened. Reports they are now every 4-5 mins. Denies LOF or vaginal bleeding. Reports fetal movement. Cervix has not been examined.

## 2019-08-08 ENCOUNTER — Encounter (HOSPITAL_COMMUNITY): Payer: Self-pay

## 2019-08-08 ENCOUNTER — Inpatient Hospital Stay (HOSPITAL_COMMUNITY): Payer: Medicaid Other | Admitting: Anesthesiology

## 2019-08-08 ENCOUNTER — Other Ambulatory Visit: Payer: Self-pay

## 2019-08-08 DIAGNOSIS — Z3A39 39 weeks gestation of pregnancy: Secondary | ICD-10-CM

## 2019-08-08 DIAGNOSIS — Z20828 Contact with and (suspected) exposure to other viral communicable diseases: Secondary | ICD-10-CM | POA: Diagnosis present

## 2019-08-08 DIAGNOSIS — Z3A40 40 weeks gestation of pregnancy: Secondary | ICD-10-CM | POA: Diagnosis not present

## 2019-08-08 DIAGNOSIS — Z87891 Personal history of nicotine dependence: Secondary | ICD-10-CM | POA: Diagnosis not present

## 2019-08-08 DIAGNOSIS — O26893 Other specified pregnancy related conditions, third trimester: Secondary | ICD-10-CM | POA: Diagnosis present

## 2019-08-08 LAB — ABO/RH: ABO/RH(D): A POS

## 2019-08-08 LAB — CBC
HCT: 35.5 % — ABNORMAL LOW (ref 36.0–46.0)
Hemoglobin: 11.6 g/dL — ABNORMAL LOW (ref 12.0–15.0)
MCH: 30.9 pg (ref 26.0–34.0)
MCHC: 32.7 g/dL (ref 30.0–36.0)
MCV: 94.7 fL (ref 80.0–100.0)
Platelets: 341 10*3/uL (ref 150–400)
RBC: 3.75 MIL/uL — ABNORMAL LOW (ref 3.87–5.11)
RDW: 15.3 % (ref 11.5–15.5)
WBC: 16.2 10*3/uL — ABNORMAL HIGH (ref 4.0–10.5)
nRBC: 0 % (ref 0.0–0.2)

## 2019-08-08 LAB — TYPE AND SCREEN
ABO/RH(D): A POS
Antibody Screen: NEGATIVE

## 2019-08-08 LAB — RPR: RPR Ser Ql: NONREACTIVE

## 2019-08-08 LAB — SARS CORONAVIRUS 2 BY RT PCR (HOSPITAL ORDER, PERFORMED IN ~~LOC~~ HOSPITAL LAB): SARS Coronavirus 2: NEGATIVE

## 2019-08-08 MED ORDER — ONDANSETRON HCL 4 MG PO TABS: 4.0000 mg | ORAL_TABLET | ORAL | Status: DC | PRN

## 2019-08-08 MED ORDER — OXYCODONE-ACETAMINOPHEN 5-325 MG PO TABS: 2.0000 | ORAL_TABLET | ORAL | Status: DC | PRN

## 2019-08-08 MED ORDER — BENZOCAINE-MENTHOL 20-0.5 % EX AERO
1.0000 "application " | INHALATION_SPRAY | CUTANEOUS | Status: DC | PRN
  Administered 2019-08-08: 1 via TOPICAL
  Filled 2019-08-08: qty 56

## 2019-08-08 MED ORDER — SIMETHICONE 80 MG PO CHEW: 80.0000 mg | CHEWABLE_TABLET | ORAL | Status: DC | PRN

## 2019-08-08 MED ORDER — ACETAMINOPHEN 325 MG PO TABS: 650.0000 mg | ORAL_TABLET | ORAL | Status: DC | PRN

## 2019-08-08 MED ORDER — COCONUT OIL OIL: 1.0000 "application " | TOPICAL_OIL | Status: DC | PRN

## 2019-08-08 MED ORDER — SODIUM CHLORIDE (PF) 0.9 % IJ SOLN
INTRAMUSCULAR | Status: DC | PRN
  Administered 2019-08-08: 12 mL/h via EPIDURAL

## 2019-08-08 MED ORDER — TETANUS-DIPHTH-ACELL PERTUSSIS 5-2.5-18.5 LF-MCG/0.5 IM SUSP: 0.5000 mL | Freq: Once | INTRAMUSCULAR | Status: DC

## 2019-08-08 MED ORDER — FENTANYL-BUPIVACAINE-NACL 0.5-0.125-0.9 MG/250ML-% EP SOLN: 12.0000 mL/h | EPIDURAL | Status: DC | PRN

## 2019-08-08 MED ORDER — OXYCODONE-ACETAMINOPHEN 5-325 MG PO TABS: 1.0000 | ORAL_TABLET | ORAL | Status: DC | PRN

## 2019-08-08 MED ORDER — FENTANYL CITRATE (PF) 100 MCG/2ML IJ SOLN
INTRAMUSCULAR | Status: AC
  Administered 2019-08-08: 01:00:00 50 ug via INTRAVENOUS
  Filled 2019-08-08: qty 2

## 2019-08-08 MED ORDER — EPHEDRINE 5 MG/ML INJ: 10.0000 mg | INTRAVENOUS | Status: DC | PRN

## 2019-08-08 MED ORDER — PHENYLEPHRINE 40 MCG/ML (10ML) SYRINGE FOR IV PUSH (FOR BLOOD PRESSURE SUPPORT): 80.0000 ug | PREFILLED_SYRINGE | INTRAVENOUS | Status: DC | PRN

## 2019-08-08 MED ORDER — FENTANYL-BUPIVACAINE-NACL 0.5-0.125-0.9 MG/250ML-% EP SOLN
EPIDURAL | Status: AC
  Filled 2019-08-08: qty 250

## 2019-08-08 MED ORDER — IBUPROFEN 600 MG PO TABS
600.0000 mg | ORAL_TABLET | Freq: Four times a day (QID) | ORAL | Status: DC
  Administered 2019-08-08 – 2019-08-09 (×4): 600 mg via ORAL
  Filled 2019-08-08 (×5): qty 1

## 2019-08-08 MED ORDER — DIPHENHYDRAMINE HCL 25 MG PO CAPS: 25.0000 mg | ORAL_CAPSULE | Freq: Four times a day (QID) | ORAL | Status: DC | PRN

## 2019-08-08 MED ORDER — OXYTOCIN 40 UNITS IN NORMAL SALINE INFUSION - SIMPLE MED
2.5000 [IU]/h | INTRAVENOUS | Status: DC
  Administered 2019-08-08: 08:00:00 2.5 [IU]/h via INTRAVENOUS
  Filled 2019-08-08: qty 1000

## 2019-08-08 MED ORDER — DIBUCAINE (PERIANAL) 1 % EX OINT: 1.0000 "application " | TOPICAL_OINTMENT | CUTANEOUS | Status: DC | PRN

## 2019-08-08 MED ORDER — DOCUSATE SODIUM 100 MG PO CAPS
100.0000 mg | ORAL_CAPSULE | Freq: Two times a day (BID) | ORAL | Status: DC
  Administered 2019-08-08: 100 mg via ORAL
  Filled 2019-08-08 (×2): qty 1

## 2019-08-08 MED ORDER — LACTATED RINGERS IV SOLN
500.0000 mL | INTRAVENOUS | Status: DC | PRN
  Administered 2019-08-08: 500 mL via INTRAVENOUS

## 2019-08-08 MED ORDER — ONDANSETRON HCL 4 MG/2ML IJ SOLN
4.0000 mg | Freq: Four times a day (QID) | INTRAMUSCULAR | Status: DC | PRN
  Administered 2019-08-08: 01:00:00 4 mg via INTRAVENOUS
  Filled 2019-08-08: qty 2

## 2019-08-08 MED ORDER — ONDANSETRON HCL 4 MG/2ML IJ SOLN: 4.0000 mg | INTRAMUSCULAR | Status: DC | PRN

## 2019-08-08 MED ORDER — DIPHENHYDRAMINE HCL 50 MG/ML IJ SOLN: 12.5000 mg | INTRAMUSCULAR | Status: DC | PRN

## 2019-08-08 MED ORDER — WITCH HAZEL-GLYCERIN EX PADS: 1.0000 "application " | MEDICATED_PAD | CUTANEOUS | Status: DC | PRN

## 2019-08-08 MED ORDER — OXYTOCIN BOLUS FROM INFUSION
500.0000 mL | Freq: Once | INTRAVENOUS | Status: AC
  Administered 2019-08-08: 08:00:00 500 mL via INTRAVENOUS

## 2019-08-08 MED ORDER — ZOLPIDEM TARTRATE 5 MG PO TABS: 5.0000 mg | ORAL_TABLET | Freq: Every evening | ORAL | Status: DC | PRN

## 2019-08-08 MED ORDER — LACTATED RINGERS IV SOLN
INTRAVENOUS | Status: DC
  Administered 2019-08-08: 03:00:00 via INTRAVENOUS

## 2019-08-08 MED ORDER — LACTATED RINGERS IV SOLN
500.0000 mL | Freq: Once | INTRAVENOUS | Status: AC
  Administered 2019-08-08: 01:00:00 500 mL via INTRAVENOUS

## 2019-08-08 MED ORDER — SENNOSIDES-DOCUSATE SODIUM 8.6-50 MG PO TABS
2.0000 | ORAL_TABLET | ORAL | Status: DC
  Administered 2019-08-08: 2 via ORAL
  Filled 2019-08-08: qty 2

## 2019-08-08 MED ORDER — PRENATAL MULTIVITAMIN CH
1.0000 | ORAL_TABLET | Freq: Every day | ORAL | Status: DC
  Administered 2019-08-08: 13:00:00 1 via ORAL
  Filled 2019-08-08 (×2): qty 1

## 2019-08-08 MED ORDER — FLEET ENEMA 7-19 GM/118ML RE ENEM: 1.0000 | ENEMA | RECTAL | Status: DC | PRN

## 2019-08-08 MED ORDER — SOD CITRATE-CITRIC ACID 500-334 MG/5ML PO SOLN
30.0000 mL | ORAL | Status: DC | PRN
  Administered 2019-08-08: 03:00:00 30 mL via ORAL
  Filled 2019-08-08: qty 30

## 2019-08-08 MED ORDER — LIDOCAINE HCL (PF) 1 % IJ SOLN: 30.0000 mL | INTRAMUSCULAR | Status: DC | PRN

## 2019-08-08 MED ORDER — FENTANYL CITRATE (PF) 100 MCG/2ML IJ SOLN
50.0000 ug | Freq: Once | INTRAMUSCULAR | Status: AC
  Administered 2019-08-08: 01:00:00 50 ug via INTRAVENOUS

## 2019-08-08 MED ORDER — PHENYLEPHRINE 40 MCG/ML (10ML) SYRINGE FOR IV PUSH (FOR BLOOD PRESSURE SUPPORT)
80.0000 ug | PREFILLED_SYRINGE | INTRAVENOUS | Status: DC | PRN
  Filled 2019-08-08: qty 10

## 2019-08-08 NOTE — Anesthesia Postprocedure Evaluation (Signed)
Anesthesia Post Note  Patient: Jordan Fowler  Procedure(s) Performed: AN AD HOC LABOR EPIDURAL     Patient location during evaluation: Mother Baby Anesthesia Type: Epidural Level of consciousness: awake Pain management: satisfactory to patient Vital Signs Assessment: post-procedure vital signs reviewed and stable Respiratory status: spontaneous breathing Cardiovascular status: stable Anesthetic complications: no    Last Vitals:  Vitals:   08/08/19 1018 08/08/19 1115  BP: (!) 96/54 (!) 96/58  Pulse: 75 72  Resp: 18 18  Temp: 36.8 C 36.7 C  SpO2: 99% 99%    Last Pain:  Vitals:   08/08/19 1400  TempSrc:   PainSc: Asleep   Pain Goal:                   [employer]

## 2019-08-08 NOTE — Anesthesia Preprocedure Evaluation (Signed)
Anesthesia Evaluation  Patient identified by MRN, date of birth, ID band Patient awake    Reviewed: Allergy & Precautions, NPO status , Patient's Chart, lab work & pertinent test results  Airway Mallampati: II  TM Distance: >3 FB Neck ROM: Full    Dental no notable dental hx. (+) Teeth Intact   Pulmonary former smoker,    Pulmonary exam normal breath sounds clear to auscultation       Cardiovascular Exercise Tolerance: Good Normal cardiovascular exam Rhythm:Regular Rate:Normal     Neuro/Psych negative neurological ROS     GI/Hepatic negative GI ROS, Neg liver ROS,   Endo/Other  negative endocrine ROS  Renal/GU negative Renal ROS     Musculoskeletal   Abdominal   Peds  Hematology Lab Results      Component                Value               Date                      WBC                      16.2 (H)            08/08/2019                HGB                      11.6 (L)            08/08/2019                HCT                      35.5 (L)            08/08/2019                MCV                      94.7                08/08/2019                PLT                      341                 08/08/2019              Anesthesia Other Findings   Reproductive/Obstetrics (+) Pregnancy                             Anesthesia Physical Anesthesia Plan  ASA: II  Anesthesia Plan: Epidural   Post-op Pain Management:    Induction:   PONV Risk Score and Plan:   Airway Management Planned:   Additional Equipment:   Intra-op Plan:   Post-operative Plan:   Informed Consent: I have reviewed the patients History and Physical, chart, labs and discussed the procedure including the risks, benefits and alternatives for the proposed anesthesia with the patient or authorized representative who has indicated his/her understanding and acceptance.       Plan Discussed with:   Anesthesia Plan  Comments: (40wk G6P2 for LEA)       Anesthesia Quick Evaluation

## 2019-08-08 NOTE — Anesthesia Procedure Notes (Signed)
Epidural Patient location during procedure: OB Start time: 08/08/2019 1:46 AM End time: 08/08/2019 1:57 AM  Staffing Anesthesiologist: Barnet Glasgow, MD Performed: anesthesiologist   Preanesthetic Checklist Completed: patient identified, site marked, surgical consent, pre-op evaluation, timeout performed, IV checked, risks and benefits discussed and monitors and equipment checked  Epidural Patient position: sitting Prep: site prepped and draped and DuraPrep Patient monitoring: continuous pulse ox and blood pressure Approach: midline Location: L3-L4 Injection technique: LOR air  Needle:  Needle type: Tuohy  Needle gauge: 17 G Needle length: 9 cm and 9 Needle insertion depth: 7 cm Catheter type: closed end flexible Catheter size: 19 Gauge Catheter at skin depth: 13 cm Test dose: negative  Assessment Events: blood not aspirated, injection not painful, no injection resistance, negative IV test and no paresthesia  Additional Notes Patient identified. Risks/Benefits/Options discussed with patient including but not limited to bleeding, infection, nerve damage, paralysis, failed block, incomplete pain control, headache, blood pressure changes, nausea, vomiting, reactions to medication both or allergic, itching and postpartum back pain. Confirmed with bedside nurse the patient's most recent platelet count. Confirmed with patient that they are not currently taking any anticoagulation, have any bleeding history or any family history of bleeding disorders. Patient expressed understanding and wished to proceed. All questions were answered. Sterile technique was used throughout the entire procedure. Please see nursing notes for vital signs. Test dose was given through epidural needle and negative prior to continuing to dose epidural or start infusion. Warning signs of high block given to the patient including shortness of breath, tingling/numbness in hands, complete motor block, or any  concerning symptoms with instructions to call for help. Patient was given instructions on fall risk and not to get out of bed. All questions and concerns addressed with instructions to call with any issues. 1 Attempt (S) . Patient tolerated procedure well.

## 2019-08-08 NOTE — H&P (Signed)
Jordan Fowler is a 28 y.o. female presenting for contractions. Endorses q4-43m contractions past three hours. +FM, denies VB, LOF. PNC c/b anemia on PO iron, h/o chlamydial infection, late to care at 16wks, GERD. OB History    Gravida  6   Para  2   Term  2   Preterm      AB  3   Living  2     SAB  1   TAB  2   Ectopic      Multiple  0   Live Births  2          Past Medical History:  Diagnosis Date  . Chlamydia 2008  . Late prenatal care affecting pregnancy in second trimester   . Normal pregnancy 08/17/2012  . SVD (spontaneous vaginal delivery) 08/17/2012  . SVD (spontaneous vaginal delivery) 06/18/2016   Past Surgical History:  Procedure Laterality Date  . DILATION AND CURETTAGE OF UTERUS    . NO PAST SURGERIES     Family History: family history includes Diabetes in her maternal grandmother and paternal grandmother. Social History:  reports that she quit smoking about 3 years ago. She smoked 0.50 packs per day. She has never used smokeless tobacco. She reports that she does not drink alcohol or use drugs.     Maternal Diabetes: No 75 Genetic Screening: Declined Maternal Ultrasounds/Referrals: Normal Fetal Ultrasounds or other Referrals:  None Maternal Substance Abuse:  No Significant Maternal Medications:  None Significant Maternal Lab Results:  Group B Strep negative Other Comments:  None  Review of Systems  Constitutional: Negative for chills and fever.  Eyes: Negative for blurred vision.  Respiratory: Negative for shortness of breath.   Cardiovascular: Negative for chest pain, palpitations and leg swelling.  Gastrointestinal: Positive for abdominal pain (ctx). Negative for nausea and vomiting.  Neurological: Negative for dizziness, weakness and headaches.  Psychiatric/Behavioral: Negative for suicidal ideas.   Maternal Medical History:  Reason for admission: Nausea.    Dilation: 4.5 Effacement (%): 70 Station: -2 Exam by:: Dr. Wilhelmenia Blase Blood  pressure 128/77, pulse 84, temperature 98.7 F (37.1 C), temperature source Oral, resp. rate 16, height 5\' 4"  (1.626 m), weight 83.9 kg, last menstrual period 11/01/2018, SpO2 100 %, unknown if currently breastfeeding. Exam Physical Exam  Constitutional: She is oriented to person, place, and time. She appears well-developed and well-nourished. No distress.  HENT:  Head: Normocephalic and atraumatic.  Eyes: Pupils are equal, round, and reactive to light.  Neck: Normal range of motion. Neck supple.  Cardiovascular: Normal rate and regular rhythm. Exam reveals no gallop.  No murmur heard. GI: There is no abdominal tenderness. There is no rebound and no guarding.  Genitourinary:    Vagina and uterus normal.   Musculoskeletal: Normal range of motion.  Neurological: She is alert and oriented to person, place, and time.  Skin: Skin is warm and dry.    Prenatal labs: ABO, Rh: --/--/A POS, A POS Performed at [facility], [place] [address] [place], [place] [address]  [phone]) Antibody: NEG (10/12 0004) Rubella: Immune (05/08 0000) RPR: Nonreactive (05/08 0000)  HBsAg: Negative (05/08 0000)  HIV: Non-reactive (05/08 0000)  GBS: Negative/-- (09/16 0000)   Assessment/Plan: This is a 28yo [id number] @ 96 0/7 by 16 6/7 scan admitted in early labor. GBS neg EFW 3300g, pelvis proven to 7lb13oz.   ADMIT TO L&D: CLD, CEFM, declining epidural at this time. Unknown gender.   [place] 08/08/2019, 1:18 AM

## 2019-08-08 NOTE — Progress Notes (Signed)
Presented for AROm assessment. Moderate meconium at AROM 0112, 4-5/70/-2. Continue to monitor, anticipate SVD. Pt s/p Fnetanyl

## 2019-08-08 NOTE — Progress Notes (Signed)
Presented for shift change eval, patient now complete/+1-+2. Plan to set up for delivery, Peds at this time 2/2 mod mec

## 2019-08-09 LAB — CBC
HCT: 29.2 % — ABNORMAL LOW (ref 36.0–46.0)
Hemoglobin: 10 g/dL — ABNORMAL LOW (ref 12.0–15.0)
MCH: 32.3 pg (ref 26.0–34.0)
MCHC: 34.2 g/dL (ref 30.0–36.0)
MCV: 94.2 fL (ref 80.0–100.0)
Platelets: 271 10*3/uL (ref 150–400)
RBC: 3.1 MIL/uL — ABNORMAL LOW (ref 3.87–5.11)
RDW: 15.5 % (ref 11.5–15.5)
WBC: 16.1 10*3/uL — ABNORMAL HIGH (ref 4.0–10.5)
nRBC: 0 % (ref 0.0–0.2)

## 2019-08-09 MED ORDER — IBUPROFEN 600 MG PO TABS: 600.0000 mg | ORAL_TABLET | Freq: Four times a day (QID) | ORAL | 1 refills | Status: DC | PRN

## 2019-08-09 NOTE — Progress Notes (Signed)
Post Partum Day 1 Subjective: no complaints, up ad lib, voiding, tolerating PO, + flatus and lochia mild. She deneis CP/HA/SOB. She is bonding well with baby - bottlefeeding. She requests early discharge to home today  Objective: Blood pressure 103/64, pulse 71, temperature 98.5 F (36.9 C), temperature source Oral, resp. rate 18, height 5\' 4"  (1.626 m), weight 83.9 kg, last menstrual period 11/01/2018, SpO2 99 %, unknown if currently breastfeeding.  Physical Exam:  General: alert, cooperative and no distress Lochia: appropriate Uterine Fundus: firm Incision: n/a DVT Evaluation: No evidence of DVT seen on physical exam.  Recent Labs    08/08/19 0004 08/09/19 0543  HGB 11.6* 10.0*  HCT 35.5* 29.2*    Assessment/Plan: Discharge home   LOS: 1 day   [place] 08/09/2019, 9:38 AM

## 2019-08-09 NOTE — Discharge Instructions (Signed)
Call office with any concerns [phone] 

## 2019-08-09 NOTE — Progress Notes (Signed)
CSW received consult due to score 12 on Edinburgh Depression Screen. CSW met with MOB to offer support and complete assessment.    MOB sitting on the side of the bed with infant asleep in bassinet, when CSW entered the room. FOB also present but spent most of assessment talking on the phone. CSW introduced self and received verbal permission from MOB to complete assessment with FOB present. CSW explained reason for consult to which MOB expressed understanding. CSW inquired about MOB's mental health history and MOB shared she has never been formally diagnosed with anything but believes she has anxiety and depression. MOB reported she has felt that way for a while but denied any history of medications or counseling. CSW inquired about if she felt this was something she needed/wanted. MOB reported "yes and no" and when CSW asked MOB to further clarify, MOB explained she had difficulty talking to people. CSW provided MOB with list of counseling resources in the event she felt that was something she was interested in. MOB receptive and appreciative of this. MOB denied any history of PPD with prior pregnancies but was receptive to education. CSW provided education regarding the baby blues period vs. perinatal mood disorders. CSW recommended self-evaluation during the postpartum time period using the New Mom Checklist from Postpartum Progress and encouraged MOB to contact a medical professional if symptoms are noted at any time. MOB did not appear to be displaying any acute mental health symptoms and denied any current SI or HI. MOB reported having good support from her mother and FOB.    MOB confirmed having all essential items for infant once discharged and reported infant would be sleeping in a bassinet once home. CSW provided review of Sudden Infant Death Syndrome (SIDS) precautions and safe sleeping habits.    CSW identifies no further need for intervention and no barriers to discharge at this time.  Elijio Miles, [place]  [facility] [employer] [phone]

## 2019-08-09 NOTE — Discharge Summary (Signed)
OB Discharge Summary     Patient Name: Jordan Fowler DOB: 06-Jul-1991 MRN: [id number]  Date of admission: 08/07/2019 Delivering MD: Ellison Hughs M   Date of discharge: 08/09/2019  Admitting diagnosis: 40wks ctx Intrauterine pregnancy: [redacted]w[redacted]d     Secondary diagnosis:  Active Problems:   [redacted] weeks gestation of pregnancy  Additional problems: none     Discharge diagnosis: Term Pregnancy Delivered                                                                                                Post partum procedures:n/a  Augmentation: AROM  Complications: None  Hospital course:  Onset of Labor With Vaginal Delivery     28 y.o. yo [id number] at [redacted]w[redacted]d was admitted in Active Labor on 08/07/2019. Patient had an uncomplicated labor course as follows:  Membrane Rupture Time/Date: 1:12 AM ,08/08/2019   Intrapartum Procedures: Episiotomy: None [1]                                         Lacerations:  None [1]  Patient had a delivery of a Viable infant. 08/08/2019  Information for the patient's newborn:  Modest, Draeger [[id number]]  Delivery Method: Vag-Spont     Pateint had an uncomplicated postpartum course.  She is ambulating, tolerating a regular diet, passing flatus, and urinating well. Patient is discharged home in stable condition on 08/09/19.   Physical exam  Vitals:   08/08/19 1115 08/08/19 1530 08/08/19 1932 08/09/19 0504  BP: (!) 96/58 (!) 102/59 93/60 103/64  Pulse: 72 71 78 71  Resp: 18 16 18 18   Temp: 98.1 F (36.7 C) 98.7 F (37.1 C) 98.5 F (36.9 C) 98.5 F (36.9 C)  TempSrc: Oral Oral Oral Oral  SpO2: 99% 99%  99%  Weight:      Height:       General: alert, cooperative and no distress Lochia: appropriate Uterine Fundus: firm Incision: N/A DVT Evaluation: No evidence of DVT seen on physical exam. Labs: Lab Results  Component Value Date   WBC 16.1 (H) 08/09/2019   HGB 10.0 (L) 08/09/2019   HCT 29.2 (L) 08/09/2019   MCV 94.2 08/09/2019   PLT 271  08/09/2019   CMP Latest Ref Rng & Units 10/06/2017  Glucose 65 - 99 mg/dL 14/08/2017)  BUN 6 - 20 mg/dL 12  Creatinine 027(X - 4.12 mg/dL 8.78  Sodium 6.76 - 720 mmol/L 139  Potassium 3.5 - 5.1 mmol/L 4.1  Chloride 101 - 111 mmol/L 108  CO2 22 - 32 mmol/L 22  Calcium 8.9 - 10.3 mg/dL 9.1  Total Protein 6.5 - 8.1 g/dL 7.2  Total Bilirubin 0.3 - 1.2 mg/dL 0.3  Alkaline Phos 38 - 126 U/L 41  AST 15 - 41 U/L 18  ALT 14 - 54 U/L 15    Discharge instruction: per After Visit Summary and "Baby and Me Booklet".  After visit meds:  Allergies as of 08/09/2019   No Known Allergies     Medication List  TAKE these medications   acetaminophen 500 MG tablet Commonly known as: TYLENOL Take 500 mg by mouth every 6 (six) hours as needed for mild pain or headache.   ibuprofen 600 MG tablet Commonly known as: ADVIL Take 1 tablet (600 mg total) by mouth every 6 (six) hours as needed for cramping.   ondansetron 4 MG tablet Commonly known as: ZOFRAN Take 1 tablet (4 mg total) by mouth every 8 (eight) hours as needed for nausea or vomiting.       Diet: routine diet  Activity: Advance as tolerated. Pelvic rest for 6 weeks.   Outpatient follow up:6 weeks Follow up Appt:No future appointments. Follow up Visit:No follow-ups on file.  Postpartum contraception: Not Discussed  Newborn Data: Live born female  Birth Weight: 7 lb 1.6 oz (3220 g) APGAR: 9, 9  Newborn Delivery   Birth date/time: 08/08/2019 07:28:00 Delivery type: Vaginal, Spontaneous      Baby Feeding: Bottle Disposition:home with mother   08/09/2019 Isaiah Serge, DO

## 2019-08-10 ENCOUNTER — Other Ambulatory Visit (HOSPITAL_COMMUNITY)
Admission: RE | Admit: 2019-08-10 | Discharge: 2019-08-10 | Disposition: A | Discharge status: Home / Self Care | Payer: Medicaid Other | Source: Ambulatory Visit

## 2019-08-10 LAB — SURGICAL PATHOLOGY

## 2019-08-12 ENCOUNTER — Inpatient Hospital Stay (HOSPITAL_COMMUNITY): Payer: Medicaid Other

## 2019-08-12 ENCOUNTER — Inpatient Hospital Stay (HOSPITAL_COMMUNITY): Admission: AD | Admit: 2019-08-12 | Payer: Medicaid Other | Source: Home / Self Care

## 2019-11-29 ENCOUNTER — Telehealth (INDEPENDENT_AMBULATORY_CARE_PROVIDER_SITE_OTHER): Payer: Medicaid Other | Admitting: Primary Care

## 2019-11-29 DIAGNOSIS — F4323 Adjustment disorder with mixed anxiety and depressed mood: Secondary | ICD-10-CM

## 2019-11-29 DIAGNOSIS — Z7689 Persons encountering health services in other specified circumstances: Secondary | ICD-10-CM

## 2019-11-29 NOTE — Progress Notes (Signed)
Virtual Visit via Telephone Note  I connected with Jordan Fowler on 11/29/19 at  3:30 PM EST by telephone and verified that I am speaking with the correct person using two identifiers.   I discussed the limitations, risks, security and privacy concerns of performing an evaluation and management service by telephone and the availability of in person appointments. I also discussed with the patient that there may be a patient responsible charge related to this service. The patient expressed understanding and agreed to proceed.   History of Present Illness: [name] is establishing care . She voices concerns with depression and anxiety. She crafts which calms her down . Past Medical History:  Diagnosis Date  . Chlamydia 2008  . Late prenatal care affecting pregnancy in second trimester   . Normal pregnancy 08/17/2012  . SVD (spontaneous vaginal delivery) 08/17/2012  . SVD (spontaneous vaginal delivery) 06/18/2016   Current Outpatient Medications on File Prior to Visit  Medication Sig Dispense Refill  . acetaminophen (TYLENOL) 500 MG tablet Take 500 mg by mouth every 6 (six) hours as needed for mild pain or headache.    . ibuprofen (ADVIL) 600 MG tablet Take 1 tablet (600 mg total) by mouth every 6 (six) hours as needed for cramping. 40 tablet 1  . ondansetron (ZOFRAN) 4 MG tablet Take 1 tablet (4 mg total) by mouth every 8 (eight) hours as needed for nausea or vomiting. 4 tablet 0   No current facility-administered medications on file prior to visit.     Observations/Objective: Review of Systems  Psychiatric/Behavioral: Positive for depression. The patient is nervous/anxious.   All other systems reviewed and are negative.   Assessment and Plan: Diagnoses and all orders for this visit:  Establishing care with new doctor, encounter for Gwinda Passe, NP-C will be your  (PCP) she is mastered prepared . She is skilled to diagnosed and treat illness. Also able to answer health  concern as well as continuing care of varied medical conditions, not limited by cause, organ system, or diagnosis.    Adjustment reaction with anxiety and depression The felling and thoughts you have voice is depression. This can and may  affect thoughts, feelings & sleep. May cause changes in mood ,relationships, daily activities & physical health,.  Follow up with CSW and discuss treatment options.  Follow Up Instructions:    I discussed the assessment and treatment plan with the patient. The patient was provided an opportunity to ask questions and all were answered. The patient agreed with the plan and demonstrated an understanding of the instructions.   The patient was advised to call back or seek an in-person evaluation if the symptoms worsen or if the condition fails to improve as anticipated.  I provided 15  minutes of non-face-to-face time during this encounter. To include reviewing previous encounters labs and imaging    Grayce Sessions, NP

## 2019-12-06 ENCOUNTER — Other Ambulatory Visit (INDEPENDENT_AMBULATORY_CARE_PROVIDER_SITE_OTHER): Payer: Self-pay | Admitting: Primary Care

## 2019-12-06 ENCOUNTER — Other Ambulatory Visit: Payer: Self-pay

## 2019-12-06 ENCOUNTER — Ambulatory Visit (INDEPENDENT_AMBULATORY_CARE_PROVIDER_SITE_OTHER): Payer: Medicaid Other | Admitting: Licensed Clinical Social Worker

## 2019-12-06 DIAGNOSIS — F4323 Adjustment disorder with mixed anxiety and depressed mood: Secondary | ICD-10-CM

## 2019-12-06 MED ORDER — FLUOXETINE HCL 20 MG PO TABS: 20.0000 mg | ORAL_TABLET | Freq: Every day | ORAL | 0 refills | Status: DC

## 2019-12-06 MED ORDER — FLUOXETINE HCL 20 MG PO CAPS: 20.0000 mg | ORAL_CAPSULE | Freq: Every day | ORAL | 1 refills | Status: DC

## 2019-12-16 NOTE — BH Specialist Note (Signed)
Integrated Behavioral Health Visit via Telemedicine (Telephone)  12/06/19 Jordan Fowler [id number]   Session Start time: 8:45 AM  Session End time: 9:10 AM Total time: 25  Referring Provider: NP Randa Evens Type of Visit: Telephonic Patient location: Home [facility] Provider location: Office All persons participating in visit: LCSW and Patient  Confirmed patient's address: Yes  Confirmed patient's phone number: Yes  Any changes to demographics: No   Confirmed patient's insurance: Yes  Any changes to patient's insurance: No   Discussed confidentiality: Yes    The following statements were read to the patient and/or legal guardian that are established with the [facility] Provider.  "The purpose of this phone visit is to provide behavioral health care while limiting exposure to the coronavirus (COVID19).  There is a possibility of technology failure and discussed alternative modes of communication if that failure occurs."  "By engaging in this telephone visit, you consent to the provision of healthcare.  Additionally, you authorize for your insurance to be billed for the services provided during this telephone visit."   Patient and/or legal guardian consented to telephone visit: Yes   PRESENTING CONCERNS: Patient and/or family reports the following symptoms/concerns: Pt reports difficulty managing increase in depression and anxiety symptoms. Pt has hx of therapy through [facility] 2018 to address childhood and relationship trauma. Symptoms include episodes of crying, sleeping for extended periods of time, low energy/motivation, irritability, racing thoughts, and worrying Duration of problem: Ongoing; Severity of problem: severe  STRENGTHS (Protective Factors/Coping Skills): Pt has desire to change Pt has good insight Pt identified healthy coping skills  GOALS ADDRESSED: Patient will: 1.  Reduce symptoms of: agitation, anxiety and depression  2.  Increase knowledge and/or ability of:  coping skills  3.  Demonstrate ability to: Increase healthy adjustment to current life circumstances and Increase adequate support systems for patient/family  INTERVENTIONS: Interventions utilized:  Solution-Focused Strategies, Supportive Counseling and Psychoeducation and/or Health Education Standardized Assessments completed: Not Needed  ASSESSMENT: Patient currently experiencing increase in depression and anxiety symptoms triggered by trauma and stress. Symptoms include episodes of crying, sleeping for extended periods of time, low energy/motivation, irritability, racing thoughts, and worrying.  Patient may benefit from therapy and medication management; however, pt is only interested in med management at this time. 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  LCSW discussed healthy coping skills and spoke with PCP regarding pt's request for medication management.   PLAN: 1. Follow up with behavioral health clinician on : Contact LCSW with any additional behavioral health or resource needs 2. Behavioral recommendations: Utilize healthy coping skills discussed, schedule follow up appt with PCP 3. Referral(s): [facility] [employer] (In Clinic)  Bridgett Larsson, [place] 12/16/19 10:24 AM

## 2019-12-20 ENCOUNTER — Telehealth (INDEPENDENT_AMBULATORY_CARE_PROVIDER_SITE_OTHER): Payer: Self-pay | Admitting: Licensed Clinical Social Worker

## 2019-12-20 ENCOUNTER — Ambulatory Visit (INDEPENDENT_AMBULATORY_CARE_PROVIDER_SITE_OTHER): Payer: Medicaid Other | Admitting: Licensed Clinical Social Worker

## 2019-12-20 NOTE — Telephone Encounter (Signed)
Call placed to patient regarding scheduled IBH appointment. There was no answer and LCSW was unable to leave a message due to voicemail being full.

## 2020-01-10 ENCOUNTER — Ambulatory Visit (INDEPENDENT_AMBULATORY_CARE_PROVIDER_SITE_OTHER): Payer: Medicaid Other | Admitting: Primary Care

## 2020-08-05 ENCOUNTER — Inpatient Hospital Stay (HOSPITAL_COMMUNITY)
Admission: AD | Admit: 2020-08-05 | Discharge: 2020-08-05 | Disposition: A | Discharge status: Home / Self Care | Payer: Medicaid Other | Attending: Obstetrics and Gynecology | Admitting: Obstetrics and Gynecology

## 2020-08-05 ENCOUNTER — Other Ambulatory Visit: Payer: Self-pay

## 2020-08-05 ENCOUNTER — Encounter (HOSPITAL_COMMUNITY): Payer: Self-pay | Admitting: Obstetrics and Gynecology

## 2020-08-05 ENCOUNTER — Inpatient Hospital Stay (HOSPITAL_COMMUNITY): Payer: Medicaid Other

## 2020-08-05 DIAGNOSIS — O26891 Other specified pregnancy related conditions, first trimester: Secondary | ICD-10-CM | POA: Diagnosis not present

## 2020-08-05 DIAGNOSIS — Z3491 Encounter for supervision of normal pregnancy, unspecified, first trimester: Secondary | ICD-10-CM

## 2020-08-05 DIAGNOSIS — O219 Vomiting of pregnancy, unspecified: Secondary | ICD-10-CM | POA: Insufficient documentation

## 2020-08-05 DIAGNOSIS — Z87891 Personal history of nicotine dependence: Secondary | ICD-10-CM | POA: Diagnosis not present

## 2020-08-05 DIAGNOSIS — Z3A08 8 weeks gestation of pregnancy: Secondary | ICD-10-CM | POA: Diagnosis not present

## 2020-08-05 DIAGNOSIS — R109 Unspecified abdominal pain: Secondary | ICD-10-CM | POA: Diagnosis not present

## 2020-08-05 DIAGNOSIS — F329 Major depressive disorder, single episode, unspecified: Secondary | ICD-10-CM | POA: Diagnosis not present

## 2020-08-05 DIAGNOSIS — Z79899 Other long term (current) drug therapy: Secondary | ICD-10-CM | POA: Diagnosis not present

## 2020-08-05 DIAGNOSIS — O99341 Other mental disorders complicating pregnancy, first trimester: Secondary | ICD-10-CM | POA: Insufficient documentation

## 2020-08-05 DIAGNOSIS — O468X1 Other antepartum hemorrhage, first trimester: Secondary | ICD-10-CM

## 2020-08-05 DIAGNOSIS — F419 Anxiety disorder, unspecified: Secondary | ICD-10-CM | POA: Diagnosis not present

## 2020-08-05 DIAGNOSIS — O418X10 Other specified disorders of amniotic fluid and membranes, first trimester, not applicable or unspecified: Secondary | ICD-10-CM

## 2020-08-05 HISTORY — DX: Anxiety disorder, unspecified: F41.9

## 2020-08-05 HISTORY — DX: Depression, unspecified: F32.A

## 2020-08-05 LAB — URINALYSIS, ROUTINE W REFLEX MICROSCOPIC
Bilirubin Urine: NEGATIVE
Glucose, UA: NEGATIVE mg/dL
Hgb urine dipstick: NEGATIVE
Ketones, ur: 20 mg/dL — AB
Leukocytes,Ua: NEGATIVE
Nitrite: NEGATIVE
Protein, ur: NEGATIVE mg/dL
Specific Gravity, Urine: 1.023 (ref 1.005–1.030)
pH: 7 (ref 5.0–8.0)

## 2020-08-05 LAB — CBC
HCT: 38 % (ref 36.0–46.0)
Hemoglobin: 12.9 g/dL (ref 12.0–15.0)
MCH: 31.9 pg (ref 26.0–34.0)
MCHC: 33.9 g/dL (ref 30.0–36.0)
MCV: 93.8 fL (ref 80.0–100.0)
Platelets: 272 10*3/uL (ref 150–400)
RBC: 4.05 MIL/uL (ref 3.87–5.11)
RDW: 12.6 % (ref 11.5–15.5)
WBC: 7.2 10*3/uL (ref 4.0–10.5)
nRBC: 0 % (ref 0.0–0.2)

## 2020-08-05 LAB — COMPREHENSIVE METABOLIC PANEL
ALT: 13 U/L (ref 0–44)
AST: 14 U/L — ABNORMAL LOW (ref 15–41)
Albumin: 4.2 g/dL (ref 3.5–5.0)
Alkaline Phosphatase: 34 U/L — ABNORMAL LOW (ref 38–126)
Anion gap: 10 (ref 5–15)
BUN: 5 mg/dL — ABNORMAL LOW (ref 6–20)
CO2: 22 mmol/L (ref 22–32)
Calcium: 9.1 mg/dL (ref 8.9–10.3)
Chloride: 101 mmol/L (ref 98–111)
Creatinine, Ser: 0.71 mg/dL (ref 0.44–1.00)
GFR, Estimated: >60 mL/min (ref >60)
Glucose, Bld: 85 mg/dL (ref 70–99)
Potassium: 3.8 mmol/L (ref 3.5–5.1)
Sodium: 133 mmol/L — ABNORMAL LOW (ref 135–145)
Total Bilirubin: 0.5 mg/dL (ref 0.3–1.2)
Total Protein: 6.8 g/dL (ref 6.5–8.1)

## 2020-08-05 LAB — HCG, QUANTITATIVE, PREGNANCY: hCG, Beta Chain, Quant, S: 108300 m[IU]/mL — ABNORMAL HIGH (ref <5)

## 2020-08-05 LAB — POCT PREGNANCY, URINE: Preg Test, Ur: POSITIVE — AB

## 2020-08-05 MED ORDER — ONDANSETRON 4 MG PO TBDP: 4.0000 mg | ORAL_TABLET | Freq: Three times a day (TID) | ORAL | 2 refills | Status: DC | PRN

## 2020-08-05 MED ORDER — FAMOTIDINE 20 MG PO TABS
20.0000 mg | ORAL_TABLET | Freq: Once | ORAL | Status: AC
  Administered 2020-08-05: 20 mg via ORAL
  Filled 2020-08-05: qty 1

## 2020-08-05 MED ORDER — ONDANSETRON 4 MG PO TBDP
8.0000 mg | ORAL_TABLET | Freq: Once | ORAL | Status: AC
  Administered 2020-08-05: 8 mg via ORAL
  Filled 2020-08-05: qty 2

## 2020-08-05 NOTE — MAU Provider Note (Addendum)
Patient Jordan Fowler is a 29 y.o.  [id number]  at [redacted]w[redacted]d here with complaints of on-going nausea/vomiting for a week. She started having abdominal pain that was "on and off" for a while. Now today she reports "all over" abdominal pain that started that today.   She denies vaginal discharge, vaginal bleeding, dysuria, constipation. She reports "loose" stools. She denies fever, she reports that she has been having hot flashes and chills when she is vomiting.   She has not had an [place] in this pregnancy. She has had a miscarriage, and two TABs. She denies any history of ectopic pregnancy.  History     CSN: [id number]  Arrival date and time: 08/05/20 1825   None     Chief Complaint  Patient presents with  . Abdominal Pain  . Nausea  . Emesis   Emesis  This is a recurrent problem. The current episode started in the past 7 days. The problem occurs 5 to 10 times per day. The problem has been unchanged. The emesis has an appearance of bile. There has been no fever. Associated symptoms include abdominal pain. Treatments tried: dramamine, eating crackers and bananas but nothing stays down.  Abdominal pain is a 2/3 out of 10. It goes up to a 10 when she is about to throw up. Vomiting makes her pain worse. She denies vaginal discharge and vaginal bleeding.   OB History    Gravida  7   Para  3   Term  3   Preterm      AB  3   Living  3     SAB  1   TAB  2   Ectopic      Multiple  0   Live Births  3           Past Medical History:  Diagnosis Date  . Anxiety   . Chlamydia 2008  . Depression   . Late prenatal care affecting pregnancy in second trimester   . Normal pregnancy 08/17/2012  . SVD (spontaneous vaginal delivery) 08/17/2012  . SVD (spontaneous vaginal delivery) 06/18/2016    Past Surgical History:  Procedure Laterality Date  . DILATION AND CURETTAGE OF UTERUS    . NO PAST SURGERIES      Family History  Problem Relation Age of Onset  . Diabetes Maternal  Grandmother   . Diabetes Paternal Grandmother     Social History   Tobacco Use  . Smoking status: Former Smoker    Packs/day: 0.50    Quit date: 06/27/2020    Years since quitting: 0.1  . Smokeless tobacco: Never Used  Vaping Use  . Vaping Use: Never used  Substance Use Topics  . Alcohol use: No  . Drug use: No    Allergies: No Known Allergies  Medications Prior to Admission  Medication Sig Dispense Refill Last Dose  . dimenhyDRINATE (DRAMAMINE) 50 MG tablet Take 50 mg by mouth every 8 (eight) hours as needed.   08/05/2020 at Unknown time  . acetaminophen (TYLENOL) 500 MG tablet Take 500 mg by mouth every 6 (six) hours as needed for mild pain or headache.     Marland Kitchen FLUoxetine (PROZAC) 20 MG capsule Take 1 capsule (20 mg total) by mouth daily. 90 capsule 1   . ondansetron (ZOFRAN) 4 MG tablet Take 1 tablet (4 mg total) by mouth every 8 (eight) hours as needed for nausea or vomiting. 4 tablet 0     Review of Systems  Constitutional: Negative.  HENT: Negative.   Respiratory: Negative for chest tightness and shortness of breath.   Gastrointestinal: Positive for abdominal pain and vomiting.  Genitourinary: Positive for pelvic pain.  Musculoskeletal: Negative.   Neurological: Negative.   Psychiatric/Behavioral: Negative.    Physical Exam   Blood pressure 116/66, pulse 89, temperature 99 F (37.2 C), temperature source Oral, resp. rate 16, last menstrual period 06/18/2020, SpO2 98 %, unknown if currently breastfeeding.  Physical Exam  MAU Course  Procedures  MDM -will try Zofran and Pepcid -due to complaints of abdominal pain and no documented IUP, she will get a full ectopic work-up.   -will collect GC CT and wet prep Assessment and Plan  Patient care endorsed to oncoming CNM at 2000.   Charlesetta Garibaldi Kooistra 08/05/2020, 7:50 PM    Labs and [place] report reviewed:  Results for orders placed or performed during the hospital encounter of 08/05/20 (from the past 24  hour(s))  Urinalysis, Routine w reflex microscopic     Status: Abnormal   Collection Time: 08/05/20  6:37 PM  Result Value Ref Range   Color, Urine YELLOW YELLOW   APPearance CLEAR CLEAR   Specific Gravity, Urine 1.023 1.005 - 1.030   pH 7.0 5.0 - 8.0   Glucose, UA NEGATIVE NEGATIVE mg/dL   Hgb urine dipstick NEGATIVE NEGATIVE   Bilirubin Urine NEGATIVE NEGATIVE   Ketones, ur 20 (A) NEGATIVE mg/dL   Protein, ur NEGATIVE NEGATIVE mg/dL   Nitrite NEGATIVE NEGATIVE   Leukocytes,Ua NEGATIVE NEGATIVE  Pregnancy, urine POC     Status: Abnormal   Collection Time: 08/05/20  6:38 PM  Result Value Ref Range   Preg Test, Ur POSITIVE (A) NEGATIVE  CBC     Status: None   Collection Time: 08/05/20  7:51 PM  Result Value Ref Range   WBC 7.2 4.0 - 10.5 K/uL   RBC 4.05 3.87 - 5.11 MIL/uL   Hemoglobin 12.9 12.0 - 15.0 g/dL   HCT 56.8 36 - 46 %   MCV 93.8 80.0 - 100.0 fL   MCH 31.9 26.0 - 34.0 pg   MCHC 33.9 30.0 - 36.0 g/dL   RDW 12.7 51.7 - 00.1 %   Platelets 272 150 - 400 K/uL   nRBC 0.0 0.0 - 0.2 %  Comprehensive metabolic panel     Status: Abnormal   Collection Time: 08/05/20  7:51 PM  Result Value Ref Range   Sodium 133 (L) 135 - 145 mmol/L   Potassium 3.8 3.5 - 5.1 mmol/L   Chloride 101 98 - 111 mmol/L   CO2 22 22 - 32 mmol/L   Glucose, Bld 85 70 - 99 mg/dL   BUN 5 (L) 6 - 20 mg/dL   Creatinine, Ser 7.49 0.44 - 1.00 mg/dL   Calcium 9.1 8.9 - 44.9 mg/dL   Total Protein 6.8 6.5 - 8.1 g/dL   Albumin 4.2 3.5 - 5.0 g/dL   AST 14 (L) 15 - 41 U/L   ALT 13 0 - 44 U/L   Alkaline Phosphatase 34 (L) 38 - 126 U/L   Total Bilirubin 0.5 0.3 - 1.2 mg/dL   GFR, Estimated >67 >59 mL/min   Anion gap 10 5 - 15  hCG, quantitative, pregnancy     Status: Abnormal   Collection Time: 08/05/20  7:55 PM  Result Value Ref Range   hCG, Beta Chain, Quant, S 108,300 (H) <5 mIU/mL   US OB Comp Less 14 Wks  Result Date: 08/05/2020 CLINICAL DATA:  Abdominal pain  and nausea and vomiting. Unsure of LMP  EXAM: OBSTETRIC <14 WK ULTRASOUND TECHNIQUE: Transabdominal ultrasound was performed for evaluation of the gestation as well as the maternal uterus and adnexal regions. COMPARISON:  None. FINDINGS: Intrauterine gestational sac: Single Yolk sac:  Visualized. Embryo:  Visualized. Cardiac Activity: Visualized. Heart Rate: 166 bpm CRL:   19 mm   8 w 3 d                  [place] EDC: 03/14/2021 Subchorionic hemorrhage: Small to moderate subchorionic hemorrhage noted. Maternal uterus/adnexae: Both ovaries are normal in appearance. No adnexal mass or free fluid identified. IMPRESSION: Single living IUP with estimated gestational age of [redacted] weeks 3 days, and [place] EDC of 03/14/2021. Small to moderate subchorionic hemorrhage. Electronically Signed   By: Danae Orleans M.D.   On: 08/05/2020 20:56   Discussed results of [place] with patient. Normal IUP with FHR seen on [place]- dating changed today based on [place] - [facility] 03/14/21 . Discussed with patient presence of [facility] and possibility of vaginal bleeding. Encouraged pelvic rest until [facility] resolved. Discussed with patient warning signs and reasons to present to MAU.   Patient reports nausea is better but still there- able to keep down crackers and water. Rx for zofran sent to pharmacy of choice. Discussed GoodRx with patient as she does not have insurance at this time.   Encouraged to make appointment in the office for prenatal care. Return to MAU as needed. Pt stable at time of discharge.   Allergies as of 08/05/2020   No Known Allergies     Medication List    STOP taking these medications   ondansetron 4 MG tablet Commonly known as: ZOFRAN     TAKE these medications   acetaminophen 500 MG tablet Commonly known as: TYLENOL Take 500 mg by mouth every 6 (six) hours as needed for mild pain or headache.   dimenhyDRINATE 50 MG tablet Commonly known as: DRAMAMINE Take 50 mg by mouth every 8 (eight) hours as needed.   FLUoxetine 20 MG capsule Commonly known as: PROzac Take 1  capsule (20 mg total) by mouth daily.   ondansetron 4 MG disintegrating tablet Commonly known as: Zofran ODT Take 1 tablet (4 mg total) by mouth every 8 (eight) hours as needed for nausea or vomiting.      Sharyon Cable, CNM 08/05/20, 10:24 PM

## 2020-08-05 NOTE — Discharge Instructions (Signed)
[place] [facility] Providers    [facility]       Phone: [phone]  [facility]   Phone: [phone]  [facility] [place]  Phone: [phone]  [facility] [place]  Phone: [phone]  [facility] [place]  Phone: [phone]  [facility]   Phone: [phone]  [facility]       Phone: [phone]  [facility]    Phone: [phone]   [facility]    Phone: [phone]  [facility]    Phone: [phone]  [facility]    Phone: [phone]  [facility]       Phone: [phone]   [facility]  Phone: [phone]  [facility]    Phone: [phone]  [facility]   Phone: [phone]  [facility]      Phone: [phone]  [facility]    Phone: [phone]   

## 2020-08-05 NOTE — MAU Note (Signed)
Jordan Fowler is a 29 y.o. at [redacted]w[redacted]d here in MAU reporting: states she has been having ongoing nausea, vomiting, food aversions, and lower abdominal pain. Emesis x 5 in the past 24 hours with constant nausea. No bleeding or discharge.  LMP: 06/18/20 approximately, pt states she knows she was on her period on this day but isnt sure if it was the first day  Onset of complaint: ongoing for a week  Pain score: 2/10  Vitals:   08/05/20 1848  BP: 116/66  Pulse: 89  Resp: 16  Temp: 99 F (37.2 C)  SpO2: 98%     Lab orders placed from triage: UA, UPT

## 2020-10-27 NOTE — L&D Delivery Note (Signed)
Kawthar Ennen is a 30 y.o. female [phone] with IUP at [redacted]w[redacted]d admitted for spontaneous onset of labor.  She progressed with AROM and pitocin augmentation to complete and pushed 1 time to deliver.  Cord clamping delayed by several minutes then clamped by CNM and cut by FOB.    Delivery Note At 2:38 PM a viable female was delivered via Vaginal, Spontaneous (Presentation: Left Occiput Anterior).  APGAR: 9, 9; weight pending.   Placenta status: Spontaneous, Intact.  Cord: 3 vessels with the following complications: nuchal x1, body x1  Anesthesia: Epidural Episiotomy:  n/a Lacerations:  none Suture Repair: n/a Est. Blood Loss (mL):  50  Mom to postpartum.  Baby to Couplet care / Skin to Skin.  Rolm Bookbinder CNM 03/16/2021, 2:51 PM

## 2020-10-29 ENCOUNTER — Inpatient Hospital Stay (HOSPITAL_COMMUNITY)
Admission: AD | Admit: 2020-10-29 | Discharge: 2020-10-30 | Disposition: A | Discharge status: Home / Self Care | Payer: Medicaid Other | Attending: Obstetrics and Gynecology | Admitting: Obstetrics and Gynecology

## 2020-10-29 ENCOUNTER — Other Ambulatory Visit: Payer: Self-pay

## 2020-10-29 DIAGNOSIS — O99612 Diseases of the digestive system complicating pregnancy, second trimester: Secondary | ICD-10-CM | POA: Insufficient documentation

## 2020-10-29 DIAGNOSIS — Z3A20 20 weeks gestation of pregnancy: Secondary | ICD-10-CM | POA: Insufficient documentation

## 2020-10-29 DIAGNOSIS — K0889 Other specified disorders of teeth and supporting structures: Secondary | ICD-10-CM

## 2020-10-29 DIAGNOSIS — Z596 Low income: Secondary | ICD-10-CM | POA: Insufficient documentation

## 2020-10-29 DIAGNOSIS — O0932 Supervision of pregnancy with insufficient antenatal care, second trimester: Secondary | ICD-10-CM | POA: Insufficient documentation

## 2020-10-29 DIAGNOSIS — K029 Dental caries, unspecified: Secondary | ICD-10-CM | POA: Insufficient documentation

## 2020-10-29 DIAGNOSIS — Z87891 Personal history of nicotine dependence: Secondary | ICD-10-CM | POA: Insufficient documentation

## 2020-10-29 NOTE — MAU Note (Signed)
PT SAYS SHE HAS A TOOTHACHE ON LEFT SIDE  - STARTED YESTERDAY - NO DENTIST . [facility]- NONE- WAITING FOR MEDICAID.

## 2020-10-30 DIAGNOSIS — O0932 Supervision of pregnancy with insufficient antenatal care, second trimester: Secondary | ICD-10-CM | POA: Diagnosis not present

## 2020-10-30 DIAGNOSIS — Z3A20 20 weeks gestation of pregnancy: Secondary | ICD-10-CM | POA: Diagnosis not present

## 2020-10-30 DIAGNOSIS — K0889 Other specified disorders of teeth and supporting structures: Secondary | ICD-10-CM

## 2020-10-30 DIAGNOSIS — O99612 Diseases of the digestive system complicating pregnancy, second trimester: Secondary | ICD-10-CM | POA: Diagnosis not present

## 2020-10-30 DIAGNOSIS — Z596 Low income: Secondary | ICD-10-CM | POA: Diagnosis not present

## 2020-10-30 DIAGNOSIS — Z87891 Personal history of nicotine dependence: Secondary | ICD-10-CM | POA: Diagnosis not present

## 2020-10-30 DIAGNOSIS — K029 Dental caries, unspecified: Secondary | ICD-10-CM | POA: Diagnosis not present

## 2020-10-30 MED ORDER — ACETAMINOPHEN 500 MG PO TABS: 1000.0000 mg | ORAL_TABLET | Freq: Three times a day (TID) | ORAL | 0 refills | Status: AC

## 2020-10-30 MED ORDER — AMOXICILLIN-POT CLAVULANATE 875-125 MG PO TABS: 1.0000 | ORAL_TABLET | Freq: Two times a day (BID) | ORAL | 0 refills | Status: AC

## 2020-10-30 MED ORDER — ONDANSETRON 4 MG PO TBDP: 4.0000 mg | ORAL_TABLET | Freq: Three times a day (TID) | ORAL | 2 refills | Status: DC | PRN

## 2020-10-30 MED ORDER — OXYCODONE HCL 5 MG PO TABS: 5.0000 mg | ORAL_TABLET | Freq: Four times a day (QID) | ORAL | 0 refills | Status: DC | PRN

## 2020-10-30 NOTE — Discharge Instructions (Signed)
You were seen for concern of dental pain. Schedule tylenol 1000mg  every 8 hours. You may also take oxycodone 5mg  nightly to help with breakthrough pain and sleep. Take augmentin 1 tablet twice daily for 7 days. It will be very important to establish with a dentist given your symptoms. Continue your prenatal vitamin and call to establish care with an OBGYN as noted below.  Prenatal Care Providers           [facility]  [address] [phone]  [facility] for [facility]   [address]  [phone]  [facility]       [address] [phone]            [facility] for [facility] @ [place]     [phone]          [facility] for [facility] @ [facility]   [address] [phone]  [facility]  [address] [phone]     [facility] for [facility] @ Family [address] [facility])  [address]   [phone]     [facility]  Phone: [phone]  Dental Assistance:  If unable to pay or uninsured, contact: [facility] to become qualified for the adult dental clinic. Patient must be enrolled in [facility] (uninsured, 0-200% FPL, qualifying info).  Enroll in [facility] first, then see Primary Care Physician assigned to you, the PCP makes a dental referral. [facility] will receive referral and contacts patient for appointment.  Patients with Medicaid           [address], [facility]  [facility] (Children up to 20 + Pregnant Women) - [phone]  [facility] - [address] - Suite [phone]  If unable to pay, or uninsured: contact [facility] Department [phone] in [place] - (Children only + Pregnant Women), [phone] in [facility]- Children only) to become  qualified for the adult dental clinic  Must see if eligible to enroll in [facility] Marketplace before enrolling into the [facility] (exemption required) [phone] for an appointment)  [facility];   [phone].  If not eligible for ACA, then go by Department of Health and Human Services to see if eligible for orange card.  [address], [place] and 325 [phone]- [address].  Once you get an orange card, you will have a Primary Care home who will then refer you to dental if needed.  Other [address]:   [facility] - [phone] (ext [phone])   [address]  Dr. [address] - [phone]   [address]    [place] - [address]   [address] [facility]           [address] [place], [place], [place], [place]           [phone], Ext. [phone]           2nd and 4th Thursday of the month at 6:30am (Simple extractions only - no wisdom teeth or surgery) First come/First serve -First 10 clients served           [facility] [place], [facility] and [place] residents only)  [address] [place], [place], [address]           [phone]                    [facility] Department           [phone]          [facility] Health Department          [phone]         [facility] - [facility]          [phone]

## 2020-10-30 NOTE — MAU Provider Note (Signed)
Chief Complaint:  Dental Pain   Event Date/Time   First Provider Initiated Contact with Patient 10/30/20 0042    HPI: Jordan Fowler is a 30 y.o. [id number] at [redacted]w[redacted]d by LMP who presents to maternity admissions reporting left-sided dental pain for several days. Pt reports worsening symptoms despite use of tylenol 500mg  BID. Last dental appointment was years ago. Pt has been unable to establish for prenatal care given difficulties with Medicaid.  She reports good fetal movement, denies LOF, vaginal bleeding, vaginal itching/burning, urinary symptoms, h/a, dizziness, n/v, or fever/chills.    Past Medical History: Past Medical History:  Diagnosis Date  . Anxiety   . Chlamydia 2008  . Depression   . Late prenatal care affecting pregnancy in second trimester   . Normal pregnancy 08/17/2012  . SVD (spontaneous vaginal delivery) 08/17/2012  . SVD (spontaneous vaginal delivery) 06/18/2016    Past obstetric history: OB History  Gravida Para Term Preterm AB Living  7 3 3   3 3   SAB IAB Ectopic Multiple Live Births  1 2   0 3    # Outcome Date GA Lbr Len/2nd Weight Sex Delivery Anes PTL Lv  7 Current           6 Term 08/08/19 [redacted]w[redacted]d 13:20 / 00:08 3220 g M Vag-Spont EPI  LIV  5 Term 06/18/16 [redacted]w[redacted]d 12:12 / 00:24 3555 g M Vag-Spont EPI  LIV  4 IAB 2016          3 IAB 2014          2 Term 08/17/12 [redacted]w[redacted]d 16:12 / 00:24 3025 g F Vag-Spont Local  LIV  1 SAB             Past Surgical History: Past Surgical History:  Procedure Laterality Date  . DILATION AND CURETTAGE OF UTERUS    . NO PAST SURGERIES      Family History: Family History  Problem Relation Age of Onset  . Diabetes Maternal Grandmother   . Diabetes Paternal Grandmother     Social History: Social History   Tobacco Use  . Smoking status: Former Smoker    Packs/day: 0.50    Quit date: 06/27/2020    Years since quitting: 0.3  . Smokeless tobacco: Never Used  Vaping Use  . Vaping Use: Never used  Substance Use Topics  .  Alcohol use: No  . Drug use: No    Allergies: No Known Allergies  Meds:  No medications prior to admission.    ROS:  Review of Systems  Constitutional: Negative for appetite change, chills and fever.  HENT: Positive for dental problem. Negative for drooling, facial swelling and sore throat.   Respiratory: Negative for cough and shortness of breath.   Cardiovascular: Negative for chest pain.  Gastrointestinal: Positive for nausea. Negative for abdominal pain and vomiting.  Genitourinary: Negative for vaginal bleeding, vaginal discharge and vaginal pain.  Neurological: Positive for headaches.   I have reviewed patient's Past Medical Hx, Surgical Hx, Family Hx, Social Hx, medications and allergies.   Physical Exam   Patient Vitals for the past 24 hrs:  BP Temp Temp src Pulse Resp Height Weight  10/29/20 2355 (!) 107/57 98.7 F (37.1 C) Oral 81 20 5\' 4"  (1.626 m) 74.5 kg   Constitutional: Well-developed, well-nourished female in no acute distress.  HEENT: poor dental hygiene with visible dental caries and multiple cracked molars. Diffuse mild gingivitis without evidence of abscesses. Cardiovascular: normal rate Respiratory: normal effort MS: Extremities nontender, no  edema, normal ROM Neurologic: Alert and oriented x 4.  Psych: appropriate affect and behavior GU: deferred  FHT: 142 on Dopplers   Labs: No results found for this or any previous visit (from the past 24 hour(s)).    Imaging:  No results found.  MAU Course/MDM: Orders Placed This Encounter  Procedures  . Discharge patient Discharge disposition: 01-Home or Self Care; Discharge patient date: 10/30/2020    Meds ordered this encounter  Medications  . acetaminophen (TYLENOL) 500 MG tablet    Sig: Take 2 tablets (1,000 mg total) by mouth every 8 (eight) hours.    Dispense:  30 tablet    Refill:  0  . ondansetron (ZOFRAN ODT) 4 MG disintegrating tablet    Sig: Take 1 tablet (4 mg total) by mouth every 8  (eight) hours as needed for nausea or vomiting.    Dispense:  30 tablet    Refill:  2  . oxyCODONE (ROXICODONE) 5 MG immediate release tablet    Sig: Take 1 tablet (5 mg total) by mouth every 6 (six) hours as needed for severe pain or breakthrough pain.    Dispense:  5 tablet    Refill:  0  . amoxicillin-clavulanate (AUGMENTIN) 875-125 MG tablet    Sig: Take 1 tablet by mouth 2 (two) times daily for 7 days.    Dispense:  14 tablet    Refill:  0     NST reviewed, reactive strip Treatments in MAU included: none. Pt discharge with strict return precautions for worsening pain, vaginal bleeding, severe abdominal pain.  Assessment: 1. Pain, dental: Pt presents with concern of 2 days of worsening dental pain despite use of tylenol 500mg  BID. Poor dental hygiene on exam with multiple visible caries and cracked molars. No evidence of dental abscesses but will treat empirically for possible dental infection s/p conversation with OB pharmacist. -instructed f/u with dental provided (provided contact info for offices accepting Medicaid) -recommended use of tylenol 1000mg  every 6 hours in addition to oxycodone 5mg  every 6 hours as needed for breakthrough pain -Augmentin BID x7 days -return precautions for fever, worsening pain, difficulty with po intake  2. [redacted] weeks gestation of pregnancy: Pt presents with desired pregnancy. Unable to establish care with prenatal provider given difficulties with Medicaid. No red flag signs/symptoms. Endorsing nausea without emesis. Taking prenatal vitamin with good adherence. No prior issues with DM or HTN. H/o term vaginal delivery x3. -encouraged continued daily prenatal vitamin -provided script for prn zofran for nausea -provided list of prenatal providers to establish care    Plan: Discharge home; provided list of prenatal providers & dental providers (urged importance of calling to establish care ASAP) Preterm labor, vaginal bleeding, severe abdominal pain  precautions  Allergies as of 10/30/2020   No Known Allergies     Medication List    TAKE these medications   acetaminophen 500 MG tablet Commonly known as: TYLENOL Take 2 tablets (1,000 mg total) by mouth every 8 (eight) hours. What changed:   how much to take  when to take this  reasons to take this   amoxicillin-clavulanate 875-125 MG tablet Commonly known as: Augmentin Take 1 tablet by mouth 2 (two) times daily for 7 days.   dimenhyDRINATE 50 MG tablet Commonly known as: DRAMAMINE Take 50 mg by mouth every 8 (eight) hours as needed.   FLUoxetine 20 MG capsule Commonly known as: PROzac Take 1 capsule (20 mg total) by mouth daily.   ondansetron 4 MG disintegrating tablet Commonly  known as: Zofran ODT Take 1 tablet (4 mg total) by mouth every 8 (eight) hours as needed for nausea or vomiting.   oxyCODONE 5 MG immediate release tablet Commonly known as: Roxicodone Take 1 tablet (5 mg total) by mouth every 6 (six) hours as needed for severe pain or breakthrough pain.      Sheila Oats, MD OB Fellow, Faculty Practice 10/30/2020 4:55 AM

## 2020-11-14 ENCOUNTER — Ambulatory Visit (INDEPENDENT_AMBULATORY_CARE_PROVIDER_SITE_OTHER): Payer: Self-pay | Admitting: Student

## 2020-11-14 ENCOUNTER — Encounter: Payer: Self-pay | Admitting: Student

## 2020-11-14 ENCOUNTER — Other Ambulatory Visit: Payer: Self-pay

## 2020-11-14 ENCOUNTER — Other Ambulatory Visit (HOSPITAL_COMMUNITY)
Admission: RE | Admit: 2020-11-14 | Discharge: 2020-11-14 | Disposition: A | Discharge status: Home / Self Care | Payer: Medicaid Other | Source: Ambulatory Visit | Attending: Student | Admitting: Student

## 2020-11-14 ENCOUNTER — Encounter: Payer: Self-pay | Admitting: Family Medicine

## 2020-11-14 VITALS — BP 97/61 | HR 83 | Wt 158.8 lb

## 2020-11-14 DIAGNOSIS — Z3492 Encounter for supervision of normal pregnancy, unspecified, second trimester: Secondary | ICD-10-CM | POA: Diagnosis present

## 2020-11-14 DIAGNOSIS — K219 Gastro-esophageal reflux disease without esophagitis: Secondary | ICD-10-CM | POA: Insufficient documentation

## 2020-11-14 DIAGNOSIS — Z3A22 22 weeks gestation of pregnancy: Secondary | ICD-10-CM

## 2020-11-14 DIAGNOSIS — Z23 Encounter for immunization: Secondary | ICD-10-CM

## 2020-11-14 MED ORDER — BLOOD PRESSURE KIT DEVI: 1.0000 | Freq: Once | 0 refills | Status: AC

## 2020-11-14 MED ORDER — BLOOD PRESSURE KIT DEVI: 1.0000 | Freq: Once | 0 refills | Status: DC

## 2020-11-14 NOTE — BH Specialist Note (Signed)
Pt did not arrive to video visit and did not answer the phone ; Left HIPPA-compliant message to call back Furkan Keenum from [facility] at [phone] (main office) or [phone] (Chinmay Squier's office).  ; left MyChart message for patient.      

## 2020-11-14 NOTE — Progress Notes (Signed)
  Subjective:    Jordan Fowler is being seen today for her first obstetrical visit.  This is not a planned pregnancy. She is at [redacted]w[redacted]d gestation. Her obstetrical history is significant for late to prenatal care. . Relationship with FOB: significant other, living together. Patient does not intend to breast feed. Pregnancy history fully reviewed.  Patient reports no complaints. She is upset because she would like an [place] today. She endorses some white discharge that is like "tissue". She denies pain or odor with this discharge. She denies dysuria. SHe had some itching for one day but it was then gone. She feels positive fetal movements.   Review of Systems:   Review of Systems  Constitutional: Negative.   HENT: Negative.   Respiratory: Negative.   Cardiovascular: Negative.   Gastrointestinal: Negative.   Genitourinary: Negative.   Musculoskeletal: Negative.   Neurological: Negative.   Psychiatric/Behavioral: Negative.     Objective:     BP 97/61   Pulse 83   Wt 158 lb 12.8 oz (72 kg)   LMP 06/18/2020 (Approximate)   BMI 27.26 kg/m  Physical Exam Constitutional:      Appearance: Normal appearance.  Cardiovascular:     Rate and Rhythm: Normal rate.  Pulmonary:     Effort: Pulmonary effort is normal.  Abdominal:     General: Abdomen is flat.  Neurological:     Mental Status: She is alert.     Exam    Assessment:    Pregnancy: [id number] Patient Active Problem List   Diagnosis Date Noted  . Gastroesophageal reflux disease 11/14/2020  . Supervision of low-risk pregnancy, second trimester 11/14/2020       Plan:     Initial labs drawn. Prenatal vitamins. Problem list reviewed and updated. AFP3 discussed: ordered. Role of ultrasound in pregnancy discussed; fetal survey: ordered. Amniocentesis discussed: not indicated. Follow up in 3 weeks for My Chart, plan for in person visit at 28 weeks 75% of 20  min visit spent on counseling and coordination of care.  -plan for  Pap PP -reports positive fetal movement -welcomed patient to the practice, explained roles of students, MDs,  -wants BTL; will sign at next visit.  -will schedule patient with Alma Friendly [facility] 11/14/2020

## 2020-11-15 ENCOUNTER — Telehealth: Payer: Self-pay

## 2020-11-15 LAB — CERVICOVAGINAL ANCILLARY ONLY
Bacterial Vaginitis (gardnerella): POSITIVE — AB
Candida Glabrata: NEGATIVE
Candida Vaginitis: POSITIVE — AB
Chlamydia: NEGATIVE
Comment: NEGATIVE
Comment: NEGATIVE
Comment: NEGATIVE
Comment: NEGATIVE
Comment: NEGATIVE
Comment: NORMAL
Neisseria Gonorrhea: NEGATIVE
Trichomonas: NEGATIVE

## 2020-11-16 ENCOUNTER — Encounter: Payer: Self-pay | Admitting: *Deleted

## 2020-11-16 LAB — AFP, SERUM, OPEN SPINA BIFIDA
AFP MoM: 1.36
AFP Value: 105.8 ng/mL
Gest. Age on Collection Date: 22.6 weeks
Maternal Age At EDD: 32.1 yr
OSBR Risk 1 IN: 4034
Test Results:: NEGATIVE
Weight: 159 [lb_av]

## 2020-11-16 LAB — URINE CULTURE, OB REFLEX

## 2020-11-16 LAB — CBC/D/PLT+RPR+RH+ABO+RUB AB...
Antibody Screen: NEGATIVE
Basophils Absolute: 0.1 10*3/uL (ref 0.0–0.2)
Basos: 1 %
EOS (ABSOLUTE): 0.4 10*3/uL (ref 0.0–0.4)
Eos: 4 %
HCV Ab: <0.1 s/co ratio (ref 0.0–0.9)
HIV Screen 4th Generation wRfx: NONREACTIVE
Hematocrit: 32.4 % — ABNORMAL LOW (ref 34.0–46.6)
Hemoglobin: 11.1 g/dL (ref 11.1–15.9)
Hepatitis B Surface Ag: NEGATIVE
Immature Grans (Abs): 0 10*3/uL (ref 0.0–0.1)
Immature Granulocytes: 0 %
Lymphocytes Absolute: 2.3 10*3/uL (ref 0.7–3.1)
Lymphs: 23 %
MCH: 33.2 pg — ABNORMAL HIGH (ref 26.6–33.0)
MCHC: 34.3 g/dL (ref 31.5–35.7)
MCV: 97 fL (ref 79–97)
Monocytes Absolute: 0.7 10*3/uL (ref 0.1–0.9)
Monocytes: 7 %
Neutrophils Absolute: 6.6 10*3/uL (ref 1.4–7.0)
Neutrophils: 65 %
Platelets: 278 10*3/uL (ref 150–450)
RBC: 3.34 x10E6/uL — ABNORMAL LOW (ref 3.77–5.28)
RDW: 12.6 % (ref 11.7–15.4)
RPR Ser Ql: NONREACTIVE
Rh Factor: POSITIVE
Rubella Antibodies, IGG: <0.9 index — ABNORMAL LOW (ref >0.99)
WBC: 10 10*3/uL (ref 3.4–10.8)

## 2020-11-16 LAB — CULTURE, OB URINE

## 2020-11-16 LAB — HCV INTERPRETATION

## 2020-11-16 NOTE — Telephone Encounter (Signed)
Called pt; VM left with anatomy US date and time. Callback number given.

## 2020-11-17 ENCOUNTER — Other Ambulatory Visit: Payer: Self-pay | Admitting: Student

## 2020-11-17 DIAGNOSIS — Z23 Encounter for immunization: Secondary | ICD-10-CM | POA: Insufficient documentation

## 2020-11-17 MED ORDER — METRONIDAZOLE 500 MG PO TABS: 500.0000 mg | ORAL_TABLET | Freq: Two times a day (BID) | ORAL | 0 refills | Status: DC

## 2020-11-17 MED ORDER — TERCONAZOLE 0.4 % VA CREA: 1.0000 | TOPICAL_CREAM | Freq: Every day | VAGINAL | 0 refills | Status: DC

## 2020-11-22 ENCOUNTER — Ambulatory Visit (INDEPENDENT_AMBULATORY_CARE_PROVIDER_SITE_OTHER): Payer: Medicaid Other | Admitting: Clinical

## 2020-11-22 DIAGNOSIS — Z5329 Procedure and treatment not carried out because of patient's decision for other reasons: Secondary | ICD-10-CM

## 2020-11-23 ENCOUNTER — Ambulatory Visit: Payer: Medicaid Other | Attending: Student

## 2020-11-23 ENCOUNTER — Other Ambulatory Visit: Payer: Self-pay

## 2020-11-23 DIAGNOSIS — Z3492 Encounter for supervision of normal pregnancy, unspecified, second trimester: Secondary | ICD-10-CM | POA: Diagnosis not present

## 2020-11-26 NOTE — BH Specialist Note (Signed)
Integrated Behavioral Health via Telemedicine Visit  11/26/2020 Jordan Fowler [id number]  Number of Integrated Behavioral Health visits: 1 Session Start time: 9:18 Session End time: 10:13 Total time: 2   Referring Provider: Luna Kitchens, CNM Patient/Family location: Home [facility] Provider [facility]  All persons participating in visit: Patient Jordan Fowler and [facility] Tierra Divelbiss   Types of Service: Telephone visit  I connected with Jordan Fowler and/or Jordan Fowler n/a by Telephone  (Video is Caregility application) and verified that I am speaking with the correct person using two identifiers.Discussed confidentiality: Yes   I discussed the limitations of telemedicine and the availability of in person appointments.  Discussed there is a possibility of technology failure and discussed alternative modes of communication if that failure occurs.  I discussed that engaging in this telemedicine visit, they consent to the provision of behavioral healthcare and the services will be billed under their insurance.  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  Pt requests to be screened for ADHD and Bipolar at follow up visit; has history of childhood trauma, and open to starting new BH medication at low-dose in pregnancy, and learning self-coping strategy today, to help manage emotions.  Duration of problem: Ongoing; Severity of problem: moderate  Patient and/or Family's Strengths/Protective Factors: Social connections and Sense of purpose  Goals Addressed: Patient  will: 1.  Reduce symptoms of: anxiety, compulsions and depression  2.  Increase knowledge and/or ability of: healthy habits and self-management skills  3.  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   Patient may benefit from psychoeducation and brief therapeutic interventions regarding coping with symptoms of anxiety, depression, compulsions .  Plan: 1. Follow up with behavioral health clinician on : Two weeks (Will screen for bipolar disorder and adhd at that time) 2. Behavioral recommendations:  -CALM relaxation breathing exercise twice daily (morning; at bedtime; may use sleep sounds if helpful at night) -Read educational materials regarding coping with symptoms of anxiety with panic -Continue taking prenatal vitamin daily 3. Referral(s): Integrated [employer] (In Clinic)  I discussed the assessment and treatment plan with the patient and/or parent/guardian. They were provided an opportunity to ask questions and all were answered. They agreed with the plan and demonstrated an understanding of the instructions.   They were advised to call back or seek an in-person evaluation if the symptoms worsen or if the condition fails to improve as anticipated.  Rae Lips, LCSW   Depression screen [facility] 2/9 12/05/2020 11/14/2020 11/29/2019  Decreased Interest 1 1 2   Down, Depressed, Hopeless 1 1 2   PHQ - 2 Score 2 2 4   Altered sleeping 3 1 1   Tired, decreased energy 3 1 1   Change in appetite 1 1 0  Feeling bad or failure about yourself  1 1 2   Trouble concentrating 1 0 3  Moving slowly or  fidgety/restless 0 0  2  Suicidal thoughts 0 0 0  PHQ-9 Score 11 6 13    GAD 7 : Generalized Anxiety Score 12/05/2020 11/14/2020 11/29/2019  Nervous, Anxious, on Edge 1 1 3   Control/stop worrying 1 1 2   Worry too much - different things 2 1 3   Trouble relaxing 3 1 2   Restless 0 1 0  Easily annoyed or irritable 3 2 1   Afraid - awful might happen 1 1 1   Total GAD 7 Score 11 8 12   Anxiety Difficulty - - Somewhat difficult

## 2020-12-05 ENCOUNTER — Encounter: Payer: Self-pay | Admitting: Advanced Practice Midwife

## 2020-12-05 ENCOUNTER — Telehealth (INDEPENDENT_AMBULATORY_CARE_PROVIDER_SITE_OTHER): Payer: Medicaid Other | Admitting: Advanced Practice Midwife

## 2020-12-05 DIAGNOSIS — K219 Gastro-esophageal reflux disease without esophagitis: Secondary | ICD-10-CM

## 2020-12-05 DIAGNOSIS — Z3A25 25 weeks gestation of pregnancy: Secondary | ICD-10-CM

## 2020-12-05 DIAGNOSIS — O26892 Other specified pregnancy related conditions, second trimester: Secondary | ICD-10-CM

## 2020-12-05 DIAGNOSIS — Z3492 Encounter for supervision of normal pregnancy, unspecified, second trimester: Secondary | ICD-10-CM

## 2020-12-05 MED ORDER — FAMOTIDINE 40 MG PO TABS
40.0000 mg | ORAL_TABLET | Freq: Every day | ORAL | 11 refills | Status: DC
Start: 2020-12-05 — End: 2021-02-06

## 2020-12-05 NOTE — Progress Notes (Signed)
I connected with  Jordan Fowler on 12/05/20 at 10:15 AM EST by MyChart and verified that I am speaking with the correct person using two identifiers.   I discussed the limitations, risks, security and privacy concerns of performing an evaluation and management service by telephone and the availability of in person appointments. I also discussed with the patient that there may be a patient responsible charge related to this service. The patient expressed understanding and agreed to proceed.  Marjo Bicker, RN 12/05/2020  10:16 AM

## 2020-12-05 NOTE — Progress Notes (Signed)
    TELEHEALTH OBSTETRICS VISIT ENCOUNTER NOTE  Provider location: [facility]   I connected with Lois Huxley on 12/05/20 at 10:15 AM EST by MyChart Video at home and verified that I am speaking with the correct person using two identifiers  I discussed the limitations, risks, security and privacy concerns of performing an evaluation and management service by telephone and the availability of in person appointments. I also discussed with the patient that there may be a patient responsible charge related to this service. The patient expressed understanding and agreed to proceed.  Subjective:  Jordan Fowler is a 30 y.o. [phone] at [redacted]w[redacted]d being followed for ongoing prenatal care.  She is currently monitored for the following issues for this low-risk pregnancy and has Gastroesophageal reflux disease; Supervision of low-risk pregnancy, second trimester; and Need for prophylactic vaccination with measles-mumps-rubella (MMR) vaccine on their problem list.  Patient reports heartburn. Reports fetal movement. Denies any contractions, bleeding or leaking of fluid.   The following portions of the patient's history were reviewed and updated as appropriate: allergies, current medications, past family history, past medical history, past social history, past surgical history and problem list.   Objective:  Last menstrual period 06/18/2020, unknown if currently breastfeeding. General:  Alert, oriented and cooperative.   Mental Status: Normal mood and affect perceived. Normal judgment and thought content.  Rest of physical exam deferred due to type of encounter  Assessment and Plan:  Pregnancy: [id number] at [redacted]w[redacted]d 1. Supervision of low-risk pregnancy, second trimester - routine care - RX pepcid for heartburn  2. [redacted] weeks gestation of pregnancy - GTT at Next visit  - Patient with depression has been on prozac in the past, but this did not help. She has an appt with  Roselyn Reef on 2/14 and can discuss other medication options if needed.   Preterm labor symptoms and general obstetric precautions including but not limited to vaginal bleeding, contractions, leaking of fluid and fetal movement were reviewed in detail with the patient.  I discussed the assessment and treatment plan with the patient. The patient was provided an opportunity to ask questions and all were answered. The patient agreed with the plan and demonstrated an understanding of the instructions. The patient was advised to call back or seek an in-person office evaluation/go to [facility] at [facility] for any urgent or concerning symptoms. Please refer to After Visit Summary for other counseling recommendations.   I provided 12 minutes of non-face-to-face time during this encounter.  Return in about 3 weeks (around 12/26/2020) for in person visit with GTT and 28 week labs .  Future Appointments  Date Time Provider [place]  12/10/2020  9:15 AM [place], CNM  12/05/20  10:53 AM

## 2020-12-05 NOTE — Patient Instructions (Signed)
COVID-19 Vaccination if You Are Pregnant or Breastfeeding ° °The Society for Maternal-Fetal Medicine (SMFM) and other pregnancy experts recommend that pregnant and lactating people be vaccinated against COVID-19. The Centers for Disease Control and Prevention (CDC) also recommend vaccination for “all people aged 32 years and older, including people who are pregnant, breastfeeding, trying to get pregnant now, or might become pregnant in the future.” Vaccination is the best way to reduce the risks of COVID-19 infection and COVID-related complications for both you and your baby. ° °Three vaccines are available to prevent COVID-19: °• The two-dose Pfizer vaccine for people 12 years and older--APPROVED by the [place] Food and Drug Administration on June 18, 2020 °• The two-dose Moderna vaccine for people 18 years and older--AUTHORIZED for emergency use °• The one-dose Johnson & Johnson vaccine for people 18 years and older (you may also see this vaccine referred to as the “Janssen vaccine”)--AUTHORIZED for emergency use ° °For those receiving the Pfizer and Moderna vaccines, the second dose is given 21 days (Pfizer) and 28 days (Moderna) after the first dose. The Johnson & Johnson vaccine is only one dose. ° °Information for Pregnant Individuals °If you are pregnant or planning to become pregnant and are thinking about getting vaccinated, consider talking with your health care professional about the vaccine.  ° °To help with your decision, you should consider the following key points: °Anyone can get the COVID vaccines free of charge regardless of immigration status or whether they have insurance. You may be asked for your social security number, but it is  °NOT required to get vaccinated. ° °What are benefits of getting the COVID-19 vaccines during pregnancy?  °• The vaccines can help protect you from getting COVID-19. With the two-dose vaccines, you must get both doses for maximum effectiveness. It’s not yet known how  long protection lasts. ° °• Another potential benefit is that getting the vaccine while pregnant may help you pass antiCOVID-19 antibodies to your baby. In numerous studies of vaccinated moms, antibodies were found in the umbilical cord blood of babies and in the mother’s breastmilk. ° °• The CDC, along with other federal partners, are monitoring people who have been vaccinated for serious side effects. So far, more than 139,000 pregnant people have been °vaccinated. No unexpected pregnancy or fetal problems have occurred. There have been no reports of any increased risk of pregnancy loss, growth problems, or birth defects. ° °• A safe vaccine is generally considered one in which the benefits of being vaccinated outweigh the risks. The current vaccines are not live vaccines. There is only a very small  °chance that they cross the placenta, so it’s unlikely that they even reach the fetus. Vaccines don’t affect future fertility. The only people who should NOT get vaccinated are those who have had a severe allergic reaction to vaccines in the past or any vaccine ingredients. ° °• Side effects may occur in the first 3 days after getting vaccinated.1 These include mild to moderate fever, headache, and muscle aches. Side effects may be worse after the second dose of the Pfizer and Moderna vaccines. Fever should be avoided during pregnancy,especially in the first trimester. Those who develop a fever after vaccination can take °acetaminophen (Tylenol). This medication is safe to use during pregnancy and does not  affect how the vaccine works.  ° °What are the known risks of getting COVID-19 during pregnancy?  °About 1 to 3 per 1,000 pregnant women with COVID-19 will develop severe disease. Compared with those who   aren’t pregnant, pregnant people infected by the COVID-19 virus: °• Are 3 times more likely to need ICU care °• Are 2 to 3 times more likely to need advanced life support and a breathing tube  °• Have a small  increased risk of dying due to COVID-19 °They may also be at increased risk of stillbirth and preterm birth. ° °What is my risk of getting COVID-19?  °Your risk of getting COVID-19 depends on the chance that you will come into contact with another infected person. The risk may be higher if you live in a community where there is a lot of COVID-19 infection or work in healthcare or another high-contact setting.  ° °What is my risk for severe complications if I get COVID-19?  °Data show that older pregnant women; those with preexisting health conditions, such as a body mass index higher than 35 kg/m2, diabetes, and heart disorders; and Black or Latinx women have an especially increased risk of severe disease and death from COVID-19. °  °If you still have questions about the vaccines or need more information, ask your health care provider or go to the Centers for Disease Control and Prevention’s COVID-19 vaccine webpage.  ° °An Update on the Johnson & Johnson Vaccine  ° °In April 2021, the FDA and CDC called for a brief pause to use of the Johnson & Johnson vaccine. They did so after reports of a severe side effect in a very small number of women younger than age 50 following vaccination. This side effect, called thrombosis with thrombocytopenia syndrome (TTS), causes blood clots (thrombosis) combined with low levels of platelets (thrombocytopenia). ° °TTS following the Johnson & Johnson vaccine is extremely rare. At the time of this update, it has occurred in only 7 people per 1 million Johnson & Johnson shots given. According to the CDC, being on hormonal birth control (the pill, patch, or ring), pregnancy, breastfeeding, or being recently pregnant does not make you more likely to develop TTS after getting the Johnson & Johnson vaccine. The pause was lifted on February 17, 2020, after the FDA and CDC determined that the known benefits of the Johnson & Johnson vaccine far outweigh the risks.  ° °Health care professionals  have been alerted to the possibility of this side effect in people who have received the Johnson & Johnson vaccine. National organizations continue to recommend COVID-19 vaccination with any of the vaccines for pregnant women. All women younger than age 50 years, whether pregnant, breastfeeding, or not, should be aware of the very rare risk of TTS after getting the Johnson & Johnson vaccine. The Pfizer and Moderna vaccines don’t have this risk. If you get the Johnson & Johnson vaccine,  °seek medical help right away if you develop any of the following symptoms within 3 weeks of getting your shot: ° °• Severe or persistent headaches or blurred vision °• Shortness of breath °• Chest pain °• Leg swelling °• Persistent abdominal pain °• Easy bruising or tiny blood spots under the skin beyond the injection site ° °Experts continue to collect health and safety information from pregnant people who have been vaccinated. If you have questions about vaccination during pregnancy, visit the CDC website or talk to your health care professional. Information for Breastfeeding/Lactating Individuals The Society for Maternal-Fetal Medicine and other pregnancy experts recommend COVID-19 vaccination for people who are breastfeeding/lactating. You don’t have to delay or stop  °breastfeeding just because you get vaccinated.  ° °Getting Vaccinated  °You can get vaccinated at   any time during pregnancy. The CDC is committed to monitoring the vaccine’s safety for all individuals. Your health professional or vaccine clinic may give you information about enrolling in the v-safe after vaccination health checker (see the box below).Even after you’re fully vaccinated, it is important to follow the CDC’s guidance for wearing a mask indoors in areas where there are substantial or high rates of COVID-19 infection.  ° °What Happens When You Enroll in v-Safe?  °The v-safe after vaccination health checker program lets the CDC check in with you after  your vaccination. At sign-up, you can indicate that you are pregnant. Once you do that, expect the following: °• Someone may call you from the v-safe program to ask initial questions and get more information. °• You may be asked to enroll in the vaccine pregnancy registry, which is collecting information about any effects of the vaccine during pregnancy. This is a great way to help scientists monitor the vaccine’s safety and effectiveness.  ° °References °1. Oliver SE, Gargano JW, Marin M, Wallace M, Curran KG, Chamberland M, et al. The Advisory Committee on Immunization Practices’ Interim Recommendation for Use of Pfizer-BioNTech  °COVID-19 Vaccine -- [place], December 2020. MMWR Morbidity and Mortality Weekly Report 2020;69. °2. FDA Briefing Document. Janssen Ad26.COV2.S Vaccine for the Prevention of COVID-19. 2021. Accessed Dec 30, 2019; Available from: https://www.fda.gov/media/146217/download °3. PFIZER-BIONTECH COVID-19 VACCINE [package insert] [place]: Pfizer and [place], [place]: Biontech;2020. °4. FDA Briefing Document. Moderna COVID-19 Vaccine. 2020. Accessed 2020, Dec 18; Available from: https://www.fda.gov/media/144434/download  °5. Gray KJ, Bordt EA, Atyeo C, Deriso E, Akinwunmi B, Young N, et al. COVID-19 vaccine response in pregnant and lactating women: a cohort study. Am J Obstet Gynecol 2021 Mar 24. °6. Panagiotakopoulos L, Myers TR, Gee J, Lipkind HS, Kharbanda EO, Ryan DS, et al. SARS-CoV-2 Infection Among Hospitalized Pregnant Women: Reasons for Admission and Pregnancy  °Characteristics - [facility], March 1-Mar 26, 2019. MMWR Morb Mortal Wkly Rep 2020 Sep 23;69(38):1355-9. °7. Zambrano LD, Ellington S, Strid P, Galang RR, Oduyebo T, Tong VT, et al. Update: Characteristics of Symptomatic Women of Reproductive Age with Laboratory-Confirmed SARSCoV-2 Infection by Pregnancy Status - [place], January 22-July 30, 2019. MMWR Morb Mortal Wkly Rep 2020 Nov  6;69(44):1641-7. °8. Delahoy MJ, Whitaker M, O'Halloran A, Chai SJ, Kirley PD, Alden N, et al. Characteristics and Maternal and Birth Outcomes of Hospitalized Pregnant Women with Laboratory-Confirmed COVID-19 - COVID-NET, 13 States, March 1-June 18, 2019. MMWR Morb Mortal Wkly Rep 2020 Sep 25;69(38):1347-54. ° °

## 2020-12-10 ENCOUNTER — Ambulatory Visit (INDEPENDENT_AMBULATORY_CARE_PROVIDER_SITE_OTHER): Payer: Medicaid Other | Admitting: Clinical

## 2020-12-10 DIAGNOSIS — F633 Trichotillomania: Secondary | ICD-10-CM

## 2020-12-10 NOTE — Patient Instructions (Signed)
Center for [facility] at [facility] Women [place], [place] [address] [phone] (main office) [phone] ([place] office)  [place] and Websites Here are a few free apps meant to help you to help yourself.  To find, try searching on the internet to see if the app is offered on Apple/Android devices. If your first choice doesn't come up on your device, the good news is that there are many choices! Play around with different apps to see which ones are helpful to you.    Calm This is an app meant to help increase calm feelings. Includes info, strategies, and tools for tracking your feelings.      Calm Harm  This app is meant to help with self-harm. Provides many 5-minute or 15-min coping strategies for doing instead of hurting yourself.       [place] is a problem-solving tool to help deal with emotions and cope with stress you encounter wherever you are.      MindShift This app can help people cope with anxiety. Rather than trying to avoid anxiety, you can make an important shift and face it.      MY3  MY3 features a support system, safety plan and resources with the goal of offering a tool to use in a time of need.       My Life My Voice  This mood journal offers a simple solution for tracking your thoughts, feelings and moods. Animated emoticons can help identify your mood.       Relax Melodies Designed to help with sleep, on this app you can mix sounds and meditations for relaxation.      Smiling Mind Smiling Mind is meditation made easy: it's a simple tool that helps put a smile on your mind.        Stop, Breathe & Think  A friendly, simple guide for people through meditations for mindfulness and compassion.  Stop, Breathe and Think Kids Enter your current feelings and choose a "mission" to help you cope. Offers videos for certain moods instead of just sound recordings.       Team  Orange The goal of this tool is to help teens change how they think, act, and react. This app helps you focus on your own good feelings and experiences.      The [place] Box The [place] Box (VHB) contains simple tools to help patients with coping, relaxation, distraction, and positive thinking.    Coping with Panic Attacks   What is a panic attack?  You may have had a panic attack if you experienced four or more of the symptoms listed below coming on abruptly and peaking in about 10 minutes.  Panic Symptoms   . Pounding heart  . Sweating  . Trembling or shaking  . Shortness of breath  . Feeling of choking  . Chest pain  . Nausea or abdominal distress    . Feeling dizzy, unsteady, lightheaded, or faint  . Feelings of unreality or being detached from yourself  . Fear of losing control or going crazy  . Fear of dying  . Numbness or tingling  . Chills or hot flashes      Panic attacks are sometimes accompanied by avoidance of certain places or situations. These are often situations that would be difficult to escape from or in which help might not be available. Examples might include crowded shopping malls, public transportation, restaurants, or driving.   Why  do panic attacks occur?   Panic attacks are the body's alarm system gone awry. All of [place] have a built-in alarm system, powered by adrenaline, which increases our heart rate, breathing, and blood flow in response to danger. Ordinarily, this 'danger response system' works well. In some people, however, the response is either out of proportion to whatever stress is going on, or may come out of the blue without any stress at all.   For example, if you are walking in the woods and see a bear coming your way, a variety of changes occur in your body to prepare you to either fight the danger or flee from the situation. Your heart rate will increase to get more blood flow around your body, your breathing rate will quicken so  that more oxygen is available, and your muscles will tighten in order to be ready to fight or run. You may feel nauseated as blood flow leaves your stomach area and moves into your limbs. These bodily changes are all essential to helping you survive the dangerous situation. After the danger has passed, your body functions will begin to go back to normal. This is because your body also has a system for "recovering" by bringing your body back down to a normal state when the danger is over.   As you can see, the emergency response system is adaptive when there is, in fact, a "true" or "real" danger (e.g., bear). However, sometimes people find that their emergency response system is triggered in "everyday" situations where there really is no true physical danger (e.g., in a meeting, in the grocery store, while driving in normal traffic, etc.).   What triggers a panic attack?  Sometimes particularly stressful situations can trigger a panic attack. For example, an argument with your spouse or stressors at work can cause a stress response (activating the emergency response system) because you perceive it as threatening or overwhelming, even if there is no direct risk to your survival.  Sometimes panic attacks don't seem to be triggered by anything in particular- they may "come out of the blue". Somehow, the natural "fight or flight" emergency response system has gotten activated when there is no real danger. Why does the body go into "emergency mode" when there is no real danger?   Often, people with panic attacks are frightened or alarmed by the physical sensations of the emergency response system. First, unexpected physical sensations are experienced (tightness in your chest or some shortness of breath). This then leads to feeling fearful or alarmed by these symptoms ("Something's wrong!", "Am I having a heart attack?", "Am I going to faint?") The mind perceives that there is a danger even though no real danger  exists. This, in turn, activates the emergency response system ("fight or flight"), leading to a "full blown" panic attack. In summary, panic attacks occur when we misinterpret physical symptoms as signs of impending death, craziness, loss of control, embarrassment, or fear of fear. Sometimes you may be aware of thoughts of danger that activate the emergency response system (for example, thinking "I'm having a heart attack" when you feel chest pressure or increased heart rate). At other times, however, you may not be aware of such thoughts. After several incidences of being afraid of physical sensations, anxiety and panic can occur in response to the initial sensations without conscious thoughts of danger. Instead, you just feel afraid or alarmed. In other words, the panic or fear may seem to occur "automatically" without you consciously telling yourself  anything.   After having had one or more panic attacks, you may also become more focused on what is going on inside your body. You may scan your body and be more vigilant about noticing any symptoms that might signal the start of a panic attack. This makes it easier for panic attacks to happen again because you pick up on sensations you might otherwise not have noticed, and misinterpret them as something dangerous. A panic attack may then result.      How do I cope with panic attacks?  An important part of overcoming panic attacks involves re-interpreting your body's physical reactions and teaching yourself ways to decrease the physical arousal. This can be done through practicing the cognitive and behavioral interventions below.   Research has found that over half of people who have panic attacks show some signs of hyperventilation or overbreathing. This can produce initial sensations that alarm you and lead to a panic attack. Overbreathing can also develop as part of the panic attack and make the symptoms worse. When people hyperventilate, certain blood  vessels in the body become narrower. In particular, the brain may get slightly less oxygen. This can lead to the symptoms of dizziness, confusion, and lightheadedness that often occur during panic attacks. Other parts of the body may also get a bit less oxygen, which may lead to numbness or tingling in the hands or feet or the sensation of cold, clammy hands. It also may lead the heart to pump harder. Although these symptoms may be frightening and feel unpleasant, it is important to remember that hyperventilating is not dangerous. However, you can help overcome the unpleasantness of overbreathing by practicing Breathing Retraining.   Practice this basic technique three times a day, every day:  . Inhale. With your shoulders relaxed, inhale as slowly and deeply as you can while you count to six. If you can, use your diaphragm to fill your lungs with air.  . Hold. Keep the air in your lungs as you slowly count to four.  . Exhale. Slowly breath out as you count to six.  . Repeat. Do the inhale-hold-exhale cycle several times. Each time you do it, exhale for longer counts.  Like any new skill, Breathing Retraining requires practice. Try practicing this skill twice a day for several minutes. Initially, do not try this technique in specific situations or when you become frightened or have a panic attack. Begin by practicing in a quiet environment to build up your skill level so that you can later use it in time of "emergency."   2. Decreasing Avoidance  Regardless of whether you can identify why you began having panic attacks or whether they seemed to come out of the blue, the places where you began having panic attacks often can become triggers themselves. It is not uncommon for individuals to begin to avoid the places where they have had panic attacks. Over time, the individual may begin to avoid more and more places, thereby decreasing their activities and often negatively impacting their quality of life. To  break the cycle of avoidance, it is important to first identify the places or situations that are being avoided, and then to do some "relearning."  To begin this intervention, first create a list of locations or situations that you tend to avoid. Then choose an avoided location or situation that you would like to target first. Now develop an "exposure hierarchy" for this situation or location. An "exposure hierarchy" is a list of actions that make  you feel anxious in this situation. Order these actions from least to most anxiety-producing. It is often helpful to have the first item on your hierarchy involve thinking or imagining part of the feared/avoided situation.   Here is an example of an exposure hierarchy for decreasing avoidance of the grocery store. Note how it is ordered from the least amount of anxiety (at the top) to the most anxiety (at the bottom):  Marland Kitchen Think about going to the grocery store alone.  . Go to the grocery store with a friend or family member.  . Go to the grocery store alone to pick up a few small items (5-10 minutes in the store).  . Shopping for 10-20 minutes in the store alone.  . Doing the shopping for the week by myself (20-30 minutes in the store).   Your homework is to "expose" yourself to the lowest item on your hierarchy and use your breathing relaxation and coping statements (see below) to help you remain in the situation. Practice this several times during the upcoming week. Once you have mastered each item with minimal anxiety, move on to the next higher action on your list.   Cognitive Interventions  1. Identify your negative self-talk Anxious thoughts can increase anxiety symptoms and panic. The first step in changing anxious thinking is to identify your own negative, alarming self-talk. Some common alarming thoughts:  . I'm having a heart attack.            . I must be going crazy. . I think I'm dying. Marland Kitchen People will think I'm crazy. . I'm going to pass our.   . Oh no- here it comes.  . I can't stand this.  Peggye Form got to get out of here!  2. Use positive coping statements Changing or disrupting a pattern of anxious thoughts by replacing them with more calming or supportive statements can help to divert a panic attack. Some common helpful coping statements:  . This is not an emergency.  . I don't like feeling this way, but I can accept it.  . I can feel like this and still be okay.  . This has happened before, and I was okay. I'll be okay this time, too.  . I can be anxious and still deal with this situation.

## 2020-12-10 NOTE — BH Specialist Note (Addendum)
Integrated Behavioral Health via Telemedicine Visit  12/10/2020 Labria Wos [id number]  Number of Integrated Behavioral Health visits: 2 Session Start time: 9:17  Session End time: 9:54 Total time: 37  Referring Provider: Luna Kitchens, CNM Patient/Family location: Home [facility] Provider location: [facility]  All persons participating in visit: Patient Jordan Fowler and [facility] Jamie McMannes   Types of Service: Individual psychotherapy  I connected with Sabas Sous and/or Creola Corn n/a by Video  (Video is Caregility application) and verified that I am speaking with the correct person using two identifiers.Discussed confidentiality: Yes   I discussed the limitations of telemedicine and the availability of in person appointments.  Discussed there is a possibility of technology failure and discussed alternative modes of communication if that failure occurs.  I discussed that engaging in this telemedicine visit, they consent to the provision of behavioral healthcare and the services will be billed under their insurance.  Patient and/or legal guardian expressed understanding and consented to Telemedicine visit: Yes   Presenting Concerns: Patient and/or family reports the following symptoms/concerns: Pt states her primary concern today is having a "rough day for no reason", waking up every 45 minutes at night(feeling overheated and anxious), fatigue, noticing migraine if no coffee in the mornings. Pt says breathing exercise helps if she remembers, but difficulty remembering daily; requests medication to help with anxious and depressed mood.  Duration of problem: Ongoing; Severity of problem: moderate  Patient and/or Family's Strengths/Protective Factors: Social connections, Concrete supports in place (healthy food, safe environments, etc.) and Sense of purpose  Goals Addressed: Patient will: 1.  Reduce symptoms of: anxiety and  depression  2.  Increase knowledge and/or ability of: healthy habits and self-management skills  3.  Demonstrate ability to: Increase healthy adjustment to current life circumstances and Increase adequate support systems for patient/family  Progress towards Goals: Ongoing  Interventions: Interventions utilized:  Solution-Focused Strategies and Psychoeducation and/or Health Education Standardized Assessments completed: MDQ; ASRS  Patient and/or Family Response: Pt agrees to treatment plan  Assessment: Patient currently experiencing Trichotillomania.   Patient may benefit from referral to psychiatry for further assessment and continued psychoeducation and brief therapeutic interventions regarding coping with symptoms of depression and anxiety .  Plan: 1. Follow up with behavioral health clinician on : Two weeks 2. Behavioral recommendations:  -Accept referral to psychiatry -Morning routine for next two weeks: Relaxation breathing exercise with morning coffee (same amount daily). Set timer for 20 minute nap, immediately after finishing morning coffee.  3. Referral(s): Integrated [employer] (In Clinic)  I discussed the assessment and treatment plan with the patient and/or parent/guardian. They were provided an opportunity to ask questions and all were answered. They agreed with the plan and demonstrated an understanding of the instructions.   They were advised to call back or seek an in-person evaluation if the symptoms worsen or if the condition fails to improve as anticipated.  Rae Lips, LCSW   Depression screen [facility] 2/9 12/05/2020 11/14/2020 11/29/2019  Decreased Interest 1 1 2   Down, Depressed, Hopeless 1 1 2   PHQ - 2 Score 2 2 4   Altered sleeping 3 1 1   Tired, decreased energy 3 1 1   Change in appetite 1 1 0  Feeling bad or failure about yourself  1 1 2   Trouble concentrating 1 0 3  Moving slowly or fidgety/restless 0 0 2  Suicidal thoughts 0 0 0  PHQ-9  Score 11 6 13  GAD 7 : Generalized Anxiety Score 12/05/2020 11/14/2020 11/29/2019  Nervous, Anxious, on Edge 1 1 3   Control/stop worrying 1 1 2   Worry too much - different things 2 1 3   Trouble relaxing 3 1 2   Restless 0 1 0  Easily annoyed or irritable 3 2 1   Afraid - awful might happen 1 1 1   Total GAD 7 Score 11 8 12   Anxiety Difficulty - - Somewhat difficult

## 2020-12-24 ENCOUNTER — Other Ambulatory Visit: Payer: Self-pay | Admitting: *Deleted

## 2020-12-24 ENCOUNTER — Ambulatory Visit (INDEPENDENT_AMBULATORY_CARE_PROVIDER_SITE_OTHER): Payer: Medicaid Other | Admitting: Clinical

## 2020-12-24 DIAGNOSIS — F633 Trichotillomania: Secondary | ICD-10-CM | POA: Diagnosis not present

## 2020-12-24 DIAGNOSIS — Z3492 Encounter for supervision of normal pregnancy, unspecified, second trimester: Secondary | ICD-10-CM

## 2020-12-24 NOTE — Addendum Note (Signed)
Addended by: Hulda Marin C on: 12/24/2020 05:34 PM   Modules accepted: Orders

## 2020-12-24 NOTE — Patient Instructions (Signed)
[facility] at [facility] [address] [place], [place] [address] [phone] (main office) [phone] (Jaleeyah Munce's office)   

## 2020-12-26 ENCOUNTER — Other Ambulatory Visit: Payer: Medicaid Other

## 2020-12-26 ENCOUNTER — Encounter: Payer: Medicaid Other | Admitting: Nurse Practitioner

## 2020-12-26 DIAGNOSIS — Z3492 Encounter for supervision of normal pregnancy, unspecified, second trimester: Secondary | ICD-10-CM

## 2020-12-28 ENCOUNTER — Encounter: Payer: Self-pay | Admitting: *Deleted

## 2020-12-31 NOTE — BH Specialist Note (Signed)
Integrated Behavioral Health via Telemedicine Visit  12/31/2020 Jordan Fowler [id number]  Number of Integrated Behavioral Health visits: 3 Session Start time: 9:48  Session End time: 10:25 Total time: 37  Referring Provider: Luna Kitchens, CNM Patient/Family location: Home [facility] Provider location: [facility] at [facility] Women  All persons participating in visit: Patient Jordan Fowler and [facility] Jesper Stirewalt   Types of Service: Individual psychotherapy and Telephone visit  I connected with Jordan Fowler and/or Jordan Fowler n/a via  Telephone or Video Enabled Telemedicine Application  (Video is Caregility application) and verified that I am speaking with the correct person using two identifiers. Discussed confidentiality: Yes   I discussed the limitations of telemedicine and the availability of in person appointments.  Discussed there is a possibility of technology failure and discussed alternative modes of communication if that failure occurs.  I discussed that engaging in this telemedicine visit, they consent to the provision of behavioral healthcare and the services will be billed under their insurance.  Patient and/or legal guardian expressed understanding and consented to Telemedicine visit: Yes   Presenting Concerns: Patient and/or family reports the following symptoms/concerns: Pt states her primary concern today is feeling "antsy, bored, unsettled, aggravated", which increases the hair-pulling; keeping hands busy with artistic projects helps, but often feels too distracted to complete projects. Pt has not heard back from psychiatry to schedule initial appointment.  Duration of problem: Ongoing; Severity of problem: moderate  Patient and/or Family's Strengths/Protective Factors: Social connections, Concrete supports in place (healthy food, safe environments, etc.) and Sense of purpose  Goals Addressed: Patient will: 1.  Reduce symptoms of:  anxiety, compulsions and depression  2.  Increase knowledge and/or ability of: self-management skills  3.  Demonstrate ability to: Increase motivation to adhere to plan of care  Progress towards Goals: Ongoing  Interventions: Interventions utilized:  Solution-Focused Strategies Standardized Assessments completed: Not Needed  Patient and/or Family Response: Pt requests medication; agrees to try suggestions in treatment plan  Assessment: Patient currently experiencing Trichotillomania.   Patient may benefit from psychoeducation and brief therapeutic interventions regarding coping with symptoms of depression, anxiety  Plan: 1. Follow up with behavioral health clinician on : Two 2. Behavioral recommendations:  -Consider setting phone timer as reminder to do relaxation breathing exercise at same time daily -Consider using dry erase board at home to list projects started. Under each project, put steps needed to complete each project. Consider picking one step (from any project posted) to complete each day.  -Consider reading through Postpartum planner, on After Visit Summary (Completing the planner can be one of the projects posted above) -Make sure voicemail is not full, to receive call from psychiatry to set up initial appointment -Discuss with medical provider tomorrow (01/10/2021) medication options for anxiety 3. Referral(s): Integrated [employer] (In Clinic)  I discussed the assessment and treatment plan with the patient and/or parent/guardian. They were provided an opportunity to ask questions and all were answered. They agreed with the plan and demonstrated an understanding of the instructions.   They were advised to call back or seek an in-person evaluation if the symptoms worsen or if the condition fails to improve as anticipated.  Jordan Fowler Jordan Ezelle, LCSW

## 2021-01-09 ENCOUNTER — Ambulatory Visit (INDEPENDENT_AMBULATORY_CARE_PROVIDER_SITE_OTHER): Payer: Medicaid Other | Admitting: Clinical

## 2021-01-09 DIAGNOSIS — F633 Trichotillomania: Secondary | ICD-10-CM | POA: Diagnosis not present

## 2021-01-09 NOTE — Patient Instructions (Signed)
[facility] for [facility] [place], [place] [address] [phone] (main office) [phone] ([place] office)     BRAINSTORMING  Develop a Plan Goals: . Provide a way to start conversation about your new life with a baby . Assist parents in recognizing and using resources within their reach . Help pave the way before birth for an easier period of transition afterwards.  Make a list of the following information to keep in a central location: . Full name of Mom and Partner: _____________________________________________ . 78 full name and Date of Birth: ___________________________________________ . Home Address: ___________________________________________________________ ________________________________________________________________________ . Home Phone: ____________________________________________________________ . Parents' cell numbers: _____________________________________________________ ________________________________________________________________________ . Name and contact info for OB: ______________________________________________ . Name and contact info for Pediatrician:________________________________________ . Contact info for Lactation Consultants: ________________________________________  REST and SLEEP *You each need at least 4-5 hours of uninterrupted sleep every day. Write specific names and contact information.* . How are you going to rest in the postpartum period? While partner's home? When partner returns to work? When you both return to work? Marland Kitchen Where will your baby sleep? Marland Kitchen Who is available to help during the day? Evening? Night? . Who could move in for a period to help support you? Marland Kitchen What are some ideas to help you get enough  sleep? __________________________________________________________________________________________________________________________________________________________________________________________________________________________________________ NUTRITIOUS FOOD AND DRINK *Plan for meals before your baby is born so you can have healthy food to eat during the immediate postpartum period.* . Who will look after breakfast? Lunch? Dinner? List names and contact information. Brainstorm quick, healthy ideas for each meal. . What can you do before baby is born to prepare meals for the postpartum period? . How can others help you with meals? Marland Kitchen Which grocery stores provide online shopping and delivery? Marland Kitchen Which restaurants offer take-out or delivery options? ______________________________________________________________________________________________________________________________________________________________________________________________________________________________________________________________________________________________________________________________________________________________________________________________________  CARE FOR MOM *It's important that mom is cared for and pampered in the postpartum period. Remember, the most important ways new mothers need care are: sleep, nutrition, gentle exercise, and time off.* . Who can come take care of mom during this period? Make a list of people with their contact information. . List some activities that make you feel cared for, rested, and energized? Who can make sure you have opportunities to do these things? . Does mom have a space of her very own within your home that's just for her? Make a "[facility]" where she can be comfortable, rest, and renew herself  daily. ______________________________________________________________________________________________________________________________________________________________________________________________________________________________________________________________________________________________________________________________________________________________________________________________________    CARE FOR AND FEEDING BABY *Knowledgeable and encouraging people will offer the best support with regard to feeding your baby.* . Educate yourself and choose the best feeding option for your baby. . Make a list of people who will guide, support, and be a resource for you as your care for and feed your baby. (Friends that have breastfed or are currently breastfeeding, lactation consultants, breastfeeding support groups, etc.) . Consider a postpartum doula. (These websites can give you information: dona.org & BuyingShow.es) . Seek out local breastfeeding resources like the breastfeeding support group at [employer] or [employer]. ______________________________________________________________________________________________________________________________________________________________________________________________________________________________________________________________________________________________________________________________________________________________________________________________________  Verner Chol AND ERRANDS . Who can help with a thorough cleaning before baby is born? . Make a list of people who will help with housekeeping and chores, like laundry, light cleaning, dishes, bathrooms, etc. . Who can run some errands for you? Marland Kitchen What can you do to make sure you are stocked with basic supplies before baby is born? . Who is going to do the  shopping? ______________________________________________________________________________________________________________________________________________________________________________________________________________________________________________________________________________________________________________________________________________________________________________________________________     Family Adjustment *Nurture yourselves.it helps parents be more loving and allows for better bonding with their child.* . What sorts of things do you and partner enjoy doing together? Which activities help you to connect and strengthen your relationship? Make a list of those things. Make a list of people whom you trust to care for your baby so you can have some time together as a couple. . What types of things help partner feel connected to Mom? Make a list. . What needs will partner have in order to bond with baby? . Other children? Who will care for them when you go into labor and while you are in the hospital? . Think about what the needs of your older children might be. Who can help you meet those needs? In what ways are you helping them prepare for bringing baby home? List some specific strategies you have for family adjustment. _______________________________________________________________________________________________________________________________________________________________________________________________________________________________________________________________________________________________________________________________________________  SUPPORT *Someone who can empathize with experiences normalizes your problems and makes them more bearable.* . Make a list of other friends, neighbors, and/or co-workers you know with infants (and small children, if applicable) with whom you can connect. . Make a list of local or online support groups, mom groups, etc. in which you can be  involved. ______________________________________________________________________________________________________________________________________________________________________________________________________________________________________________________________________________________________________________________________________________________________________________________________________  Childcare Plans . Investigate and plan for childcare if mom is returning to work. . Talk about mom's concerns about her transition back to work. . Talk about partner's concerns regarding this transition.  Mental Health *Your mental health is one of the highest priorities for a pregnant or postpartum mom.* . 1 in 5 women experience anxiety and/or depression from the time of conception through the first year after birth. . Postpartum Mood Disorders are the #1 complication of pregnancy and childbirth and the suffering experienced by these mothers is not necessary! These illnesses are temporary and respond well to treatment, which often includes self-care, social support, talk therapy, and medication when needed. . Women experiencing anxiety and depression often say things like: "I'm supposed to be happy.why do I feel so sad?", "Why can't I snap out of it?", "I'm having thoughts that scare me." . There is no need to be embarrassed if you are feeling these symptoms: o Overwhelmed, anxious, angry, sad, guilty, irritable, hopeless, exhausted but can't sleep o You are NOT alone. You are NOT to blame. With help, you WILL be well. . Where can I find help? Medical professionals such as your OB, midwife, gynecologist, family practitioner, primary care provider, pediatrician, or mental health providers; [facility] support groups: Feelings After Birth, Breastfeeding Support Group, Baby and Me Group, and Fit 4 Two exercise classes. . You have permission to ask for help. It will confirm your feelings, validate your  experiences, share/learn coping strategies, and gain support and encouragement as you heal. You are important! BRAINSTORM . Make a list of local resources, including resources for mom and for partner. . Identify support groups. . Identify people to call late at night - include names and contact info. . Talk with partner about perinatal mood and anxiety disorders. . Talk with your OB, midwife, and doula about baby blues and about perinatal mood and anxiety disorders. . Talk with your pediatrician about perinatal mood and anxiety disorders.   Support & Sanity Savers   What do you really need?  . Basics . In preparing for a new baby, many expectant parents spend hours shopping for baby clothes, decorating the nursery, and deciding which car seat to   buy. Yet most don't think much about what the reality of parenting a newborn will be like, and what they need to make it through that. So, here is the advice of experienced parents. We know you'll read this, and think "they're exaggerating, I don't really need that." Just trust [place] on these, OK? Plan for all of this, and if it turns out you don't need it, come back and teach [place] how you did it!  Satira Anis (Once baby's survival needs are met, make sure you attend to your own survival needs!) . Sleep . An average newborn sleeps 16-18 hours per day, over 6-7 sleep periods, rarely more than three hours at a time. It is normal and healthy for a newborn to wake throughout the night... but really hard on parents!! . Naps. Prioritize sleep above any responsibilities like: cleaning house, visiting friends, running errands, etc.  Sleep whenever baby sleeps. If you can't nap, at least have restful times when baby eats. The more rest you get, the more patient you will be, the more emotionally stable, and better at solving problems.  . Food . You may not have realized it would be difficult to eat when you have a newborn. Yet, when we talk to . countless new  parents, they say things like "it may be 2:00 pm when I realize I haven't had breakfast yet." Or "every time we sit down to dinner, baby needs to eat, and my food gets cold, so I don't bother to eat it." . Finger food. Before your baby is born, stock up with one months' worth of food that: 1) you can eat with one hand while holding a baby, 2) doesn't need to be prepped, 3) is good hot or cold, 4) doesn't spoil when left out for a few hours, and 5) you like to eat. Think about: nuts, dried fruit, Clif bars, pretzels, jerky, gogurt, baby carrots, apples, bananas, crackers, cheez-n-crackers, string cheese, hot pockets or frozen burritos to microwave, garden burgers and breakfast pastries to put in the toaster, yogurt drinks, etc. . Restaurant Menus. Make lists of your favorite restaurants & menu items. When family/friends want to help, you can give specific information without much thought. They can either bring you the food or send gift cards for just the right meals. Rosaura Carpenter Meals.  Take some time to make a few meals to put in the freezer ahead of time.  Easy to freeze meals can be anything such as soup, lasagna, chicken pie, or spaghetti sauce. . Set up a Meal Schedule.  Ask friends and family to sign up to bring you meals during the first few weeks of being home. (It can be passed around at baby showers!) You have no idea how helpful this will be until you are in the throes of parenting.  https://hamilton-woodard.com/ is a great website to check out. . Emotional Support . Know who to call when you're stressed out. Parenting a newborn is very challenging work. There are times when it totally overwhelms your normal coping abilities. EVERY NEW PARENT NEEDS TO HAVE A PLAN FOR WHO TO CALL WHEN THEY JUST CAN'T COPE ANY MORE. (And it has to be someone other than the baby's other parent!) Before your baby is born, come up with at least one person you can call for support - write their phone number down and post it on the  refrigerator. Marland Kitchen Anxiety & Sadness. Baby blues are normal after pregnancy; however, there are more severe types of anxiety &  sadness which can occur and should not be ignored.  They are always treatable, but you have to take the first step by reaching out for help. [facility] offers a "Mom Talk" group which meets every Tuesday from 10 am - 11 am.  This group is for new moms who need support and connection after their babies are born.  Call [phone].  Marland Kitchen Really, Really Helpful (Plan for them! Make sure these happen often!!) . Physical Support with Taking Care of Yourselves . Asking friends and family. Before your baby is born, set up a schedule of people who can come and visit and help out (or ask a friend to schedule for you). Any time someone says "let me know what I can do to help," sign them up for a day. When they get there, their job is not to take care of the baby (that's your job and your joy). Their job is to take care of you!  . Postpartum doulas. If you don't have anyone you can call on for support, look into postpartum doulas:  professionals at helping parents with caring for baby, caring for themselves, getting breastfeeding started, and helping with household tasks. www.padanc.org is a helpful website for learning about doulas in our area. . Peer Support / Parent Groups . Why: One of the greatest ideas for new parents is to be around other new parents. Parent groups give you a chance to share and listen to others who are going through the same season of life, get a sense of what is normal infant development by watching several babies learn and grow, share your stories of triumph and struggles with empathetic ears, and forgive your own mistakes when you realize all parents are learning by trial and error. . Where to find: There are many places you can meet other new parents throughout our community.  [facility] offers the following classes for new moms and their little ones:  Baby  and Me (Birth to [place]) and Breastfeeding Support Group. Go to www.conehealthybaby.com or call [phone] for more information. . Time for your Relationship . It's easy to get so caught up in meeting baby's immediate needs that it's hard to find time to connect with your partner, and meet the needs of your relationship. It's also easy to forget what "quality time with your partner" actually looks like. If you take your baby on a date, you'd be amazed how much of your couple time is spent feeding the baby, diapering the baby, admiring the baby, and talking about the baby. . Dating: Try to take time for just the two of you. Babysitter tip: Sometimes when moms are breastfeeding a newborn, they find it hard to figure out how to schedule outings around baby's unpredictable feeding schedules. Have the babysitter come for a three hour period. When she comes over, if baby has just eaten, you can leave right away, and come back in two hours. If baby hasn't fed recently, you start the date at home. Once baby gets hungry and gets a good feeding in, you can head out for the rest of your date time. . Date Nights at Home: If you can't get out, at least set aside one evening a week to prioritize your relationship: whenever baby dozes off or doesn't have any immediate needs, spend a little time focusing on each other. . Potential conflicts: The main relationship conflicts that come up for new parents are: issues related to sexuality, financial stresses, a feeling of an unfair division  of household tasks, and conflicts in parenting styles. The more you can work on these issues before baby arrives, the better!  . Fun and Frills (Don't forget these. and don't feel guilty for indulging in them!) . Everyone has something in life that is a fun little treat that they do just for themselves. It may be: reading the morning paper, or going for a daily jog, or having coffee with a friend once a week, or going to a movie on Friday  nights, or fine chocolates, or bubble baths, or curling up with a good book. . Unless you do fun things for yourself every now and then, it's hard to have the energy for fun with your baby. Whatever your "special" treats are, make sure you find a way to continue to indulge in them after your baby is born. These special moments can recharge you, and allow you to return to baby with a new joy   PERINATAL MOOD DISORDERS: MATERNAL MENTAL HEALTH FROM CONCEPTION THROUGH THE POSTPARTUM PERIOD   Emergency and Crisis Resources:  If you are an imminent risk to self or others, are experiencing intense personal distress, and/or have noticed significant changes in activities of daily living, call:  . 911 . [facility]: [phone] . Mobile Crisis: 877-626-1772 . National Suicide Hotline: 1-800-273-8255 Or visit the following crisis centers: . Local Emergency Departments . [facility]: [address], [place] [phone]. Hours: 8:30AM-5PM. Insurance Accepted: Medicaid, Medicare, and Uninsured.  . [facility]  [address], [place] Mon-Friday 8am-3pm  [phone]                                                                                    Non-Crisis Resources: To identify specific providers that are covered by your insurance, contact your insurance company or local agencies: [facility]: 1-800-256-2452 [facility]: 888-581-9988 [employer]: 1-800-939-5911 Postpartum Support International- [place] 1-800-944-4773                                                      Outpatient therapy and medication management providers:  [facility] [phone] Hours: 9AM-5PM  Insurance Accepted: AARP, Aetna, BCBS, Cigna, [employer], Humana, Medicare  [facility] [phone] Hours: 8AM-5PM  nsurance Accepted: All insurances EXCEPT AARP, Aetna, [employer], and  [employer] [facility]: [phone]             Hours: 8AM-8PM Insurance Accepted: Aetna, BCBS, Cigna, [employer], Medicaid, Medicare, Uninsured [facility]: [phone] [facility]: [phone] Hours: 8:30AM-7PM Insurance Accepted: Aetna, BCBS, Medicaid, Medicare, Tricare, United [facility]:  [phone]              Hours:9AM-5PM Insurance Accepted:  Aetna, BCBS, [facility], Medicaid, United Health Care  [facility] [phone] Hours: 9AM-5:30PM Insurance Accepted: AARP, Aetna, BCBS, Cigna, and Medicaid, Medicare, United Health Care [facility]:  [phone] Hours: 9am-5pm Insurance Accepted: BCBS; they do not accept Medicaid/Medicare   [facility]: [phone] Hours: 9am-9pm Insurance Accepted: All major insurance including Medicaid and Medicare [facility]: [phone] Hours: 9AM-5:30PM Insurance Accepted: All insurances EXCEPT Medicaid and Medicare. [facility]: [place]: [phone] [place]:  [place] (support for children in the NICU and/or with special needs), [place] [facility]: [phone]                                                                                     Online Resources: Postpartum Support International: http://jones-berg.com/  800-944-4PPD 2Moms Supporting Moms:  www.momssupportingmoms.net

## 2021-01-09 NOTE — BH Specialist Note (Signed)
Pt did not arrive to video visit and did not answer the phone ; Unable to leave voice message, as mailbox is full; left MyChart message for patient.

## 2021-01-10 ENCOUNTER — Other Ambulatory Visit: Payer: Self-pay

## 2021-01-10 ENCOUNTER — Telehealth (INDEPENDENT_AMBULATORY_CARE_PROVIDER_SITE_OTHER): Payer: Medicaid Other | Admitting: Certified Nurse Midwife

## 2021-01-10 ENCOUNTER — Encounter: Payer: Self-pay | Admitting: Certified Nurse Midwife

## 2021-01-10 VITALS — BP 103/58 | HR 78

## 2021-01-10 DIAGNOSIS — F909 Attention-deficit hyperactivity disorder, unspecified type: Secondary | ICD-10-CM

## 2021-01-10 DIAGNOSIS — O99343 Other mental disorders complicating pregnancy, third trimester: Secondary | ICD-10-CM

## 2021-01-10 DIAGNOSIS — Z3493 Encounter for supervision of normal pregnancy, unspecified, third trimester: Secondary | ICD-10-CM

## 2021-01-10 DIAGNOSIS — Z3A31 31 weeks gestation of pregnancy: Secondary | ICD-10-CM

## 2021-01-10 DIAGNOSIS — F32A Depression, unspecified: Secondary | ICD-10-CM

## 2021-01-10 DIAGNOSIS — F419 Anxiety disorder, unspecified: Secondary | ICD-10-CM

## 2021-01-10 MED ORDER — BUPROPION HCL ER (XL) 150 MG PO TB24: 150.0000 mg | ORAL_TABLET | Freq: Every day | ORAL | 3 refills | Status: DC

## 2021-01-10 NOTE — Progress Notes (Signed)
Slight swelling in feet when she stands for a long time. Also is having pressure "on top of rib cage" and feels this pressure about 4 days a week.

## 2021-01-10 NOTE — Patient Instructions (Signed)
Living With Attention Deficit Hyperactivity Disorder If you have been diagnosed with attention deficit hyperactivity disorder (ADHD), you may be relieved that you now know why you have felt or behaved a certain way. Still, you may feel overwhelmed about the treatment ahead. You may also wonder how to get the support you need and how to deal with the condition day-to-day. With treatment and support, you can live with ADHD and manage your symptoms. How to manage lifestyle changes Managing stress Stress is your body's reaction to life changes and events, both good and bad. To cope with the stress of an ADHD diagnosis, it may help to:  Learn more about ADHD.  Exercise regularly. Even a short daily walk can lower stress levels.  Participate in training or education programs (including social skills training classes) that teach you to deal with symptoms.   Medicines Your health care provider may suggest certain medicines if he or she feels that they will help to improve your condition. Stimulant medicines are usually prescribed to treat ADHD, and therapy may also be prescribed. It is important to:  Avoid using alcohol and other substances that may prevent your medicines from working properly (may interact).  Talk with your pharmacist or health care provider about all the medicines that you take, their possible side effects, and what medicines are safe to take together.  Make it your goal to take part in all treatment decisions (shared decision-making). Ask about possible side effects of medicines that your health care provider recommends, and tell him or her how you feel about having those side effects. It is best if shared decision-making with your health care provider is part of your total treatment plan. Relationships To strengthen your relationships with family members while treating your condition, consider taking part in family therapy. You might also attend self-help groups alone or with a loved  one. Be honest about how your symptoms affect your relationships. Make an effort to communicate respectfully instead of fighting, and find ways to show others that you care. Psychotherapy may be useful in helping you cope with how ADHD affects your relationships. How to recognize changes in your condition The following signs may mean that your treatment is working well and your condition is improving:  Consistently being on time for appointments.  Being more organized at home and work.  Other people noticing improvements in your behavior.  Achieving goals that you set for yourself.  Thinking more clearly. The following signs may mean that your treatment is not working very well:  Feeling impatience or more confusion.  Missing, forgetting, or being late for appointments.  An increasing sense of disorganization and messiness.  More difficulty in reaching goals that you set for yourself.  Loved ones becoming angry or frustrated with you. Follow these instructions at home:  Take over-the-counter and prescription medicines only as told by your health care provider. Check with your health care provider before taking any new medicines.  Create structure and an organized atmosphere at home. For example: ? Make a list of tasks, then rank them from most important to least important. Work on one task at a time until your listed tasks are done. ? Make a daily schedule and follow it consistently every day. ? Use an appointment calendar, and check it 2 or 3 times a day to keep on track. Keep it with you when you leave the house. ? Create spaces where you keep certain things, and always put things back in their places after you   use them.  Keep all follow-up visits as told by your health care provider. This is important. Where to find support Talking to others  Keep emotion out of important discussions and speak in a calm, logical way.  Listen closely and patiently to your loved ones. Try to  understand their point of view, and try to avoid getting defensive.  Take responsibility for the consequences of your actions.  Ask that others do not take your behaviors personally.  Aim to solve problems as they come up, and express your feelings instead of bottling them up.  Talk openly about what you need from your loved ones and how they can support you.  Consider going to family therapy sessions or having your family meet with a specialist who deals with ADHD-related behavior problems.   Finances Not all insurance plans cover mental health care, so it is important to check with your insurance carrier. If paying for co-pays or counseling services is a problem, search for a local or county mental health care center. Public mental health care services may be offered there at a low cost or no cost when you are not able to see a private health care provider. If you are taking medicine for ADHD, you may be able to get the generic form, which may be less expensive than brand-name medicine. Some makers of prescription medicines also offer help to patients who cannot afford the medicines that they need. Questions to ask your health care provider:  What are the risks and benefits of taking medicines?  Would I benefit from therapy?  How often should I follow up with a health care provider? Contact a health care provider if:  You have side effects from your medicines, such as: ? Repeated muscle twitches, coughing, or speech outbursts. ? Sleep problems. ? Loss of appetite. ? Depression. ? New or worsening behavior problems. ? Dizziness. ? Unusually fast heartbeat. ? Stomach pains. ? Headaches. Get help right away if:  You have a severe reaction to a medicine.  Your behavior suddenly gets worse. Summary  With treatment and support, you can live with ADHD and manage your symptoms.  The medicines that are most often prescribed for ADHD are stimulants.  Consider taking part in family  therapy or self-help groups with family members or friends.  When you talk with friends and family about your ADHD, be patient and communicate openly.  Take over-the-counter and prescription medicines only as told by your health care provider. Check with your health care provider before taking any new medicines. This information is not intended to replace advice given to you by your health care provider. Make sure you discuss any questions you have with your health care provider. Document Revised: 03/28/2020 Document Reviewed: 03/28/2020 Elsevier Patient Education  2021 Elsevier Inc. Attention Deficit Hyperactivity Disorder, Adult Attention deficit hyperactivity disorder (ADHD) is a mental health disorder that starts during childhood (neurodevelopmental disorder). For many people with ADHD, the disorder continues into the adult years. Treatment can help you manage your symptoms. What are the causes? The exact cause of ADHD is not known. Most experts believe genetics and environmental factors contribute to ADHD. What increases the risk? The following factors may make you more likely to develop this condition:  Having a family history of ADHD.  Being female.  Being born to a mother who smoked or drank alcohol during pregnancy.  Being exposed to lead or other toxins in the womb or early in life.  Being born before 37 weeks  of pregnancy (prematurely) or at a low birth weight.  Having experienced a brain injury. What are the signs or symptoms? Symptoms of this condition depend on the type of ADHD. The two main types are inattentive and hyperactive-impulsive. Some people may have symptoms of both types. Symptoms of the inattentive type include:  Difficulty paying attention.  Making careless mistakes.  Not following instructions.  Being disorganized.  Avoiding tasks that require time and attention.  Losing and forgetting things.  Being easily distracted. Symptoms of the  hyperactive-impulsive type include:  Restlessness.  Talking too much.  Interrupting.  Difficulty with: ? Sitting still. ? Feeling motivated. ? Relaxing. ? Waiting in line or waiting for a turn. In adults, this condition may lead to certain problems, such as:  Keeping jobs.  Performing tasks at work.  Having stable relationships.  Being on time or keeping to a schedule. How is this diagnosed? This condition is diagnosed based on your current symptoms and your history of symptoms. The diagnosis can be made by a health care provider such as a primary care provider or a mental health care specialist. Your health care provider may use a symptom checklist or a behavior rating scale to evaluate your symptoms. He or she may also want to talk with people who have observed your behaviors throughout your life. How is this treated? This condition can be treated with medicines and behavior therapy. Medicines may be the best option to reduce impulsive behaviors and improve attention. Your health care provider may recommend:  Stimulant medicines. These are the most common medicines used for adult ADHD. They affect certain chemicals in the brain (neurotransmitters) and improve your ability to control your symptoms.  A non-stimulant medicine for adult ADHD (atomoxetine). This medicine increases a neurotransmitter called norepinephrine. It may take weeks to months to see effects from this medicine. Counseling and behavioral management are also important for treating ADHD. Counseling is often used along with medicine. Your health care provider may suggest:  Cognitive behavioral therapy (CBT). This type of therapy teaches you to replace negative thoughts and actions with positive thoughts and actions. When used as part of ADHD treatment, this therapy may also include: ? Coping strategies for organization, time management, impulse control, and stress reduction. ? Mindfulness and meditation  training.  Behavioral management. You may work with a Psychologist, occupational who is specially trained to help people with ADHD manage and organize activities and function more effectively. Follow these instructions at home: Medicines  Take over-the-counter and prescription medicines only as told by your health care provider.  Talk with your health care provider about the possible side effects of your medicines and how to manage them.   Lifestyle  Do not use drugs.  Do not drink alcohol if: ? Your health care provider tells you not to drink. ? You are pregnant, may be pregnant, or are planning to become pregnant.  If you drink alcohol: ? Limit how much you use to:  0-1 drink a day for women.  0-2 drinks a day for men. ? Be aware of how much alcohol is in your drink. In the [place] one drink equals one 12 oz bottle of beer (355 mL), one 5 oz glass of wine (148 mL), or one 1 oz glass of hard liquor (44 mL).  Get enough sleep.  Eat a healthy diet.  Exercise regularly. Exercise can help to reduce stress and anxiety.   General instructions  Learn as much as you can about adult ADHD, and work  closely with your health care providers to find the treatments that work best for you.  Follow the same schedule each day.  Use reminder devices like notes, calendars, and phone apps to stay on time and organized.  Keep all follow-up visits as told by your health care provider and therapist. This is important. Where to find more information A health care provider may be able to recommend resources that are available online or over the phone. You could start with:  Attention Deficit Disorder Association (ADDA): http://davis-dillon.net/  [employer] of Mental Health [facility]): http://www.maynard.net/ Contact a health care provider if:  Your symptoms continue to cause problems.  You have side effects from your medicine, such as: ? Repeated muscle twitches, coughing, or speech outbursts. ? Sleep problems. ? Loss of  appetite. ? Dizziness. ? Unusually fast heartbeat. ? Stomach pains. ? Headaches.  You are struggling with anxiety, depression, or substance abuse. Get help right away if you:  Have a severe reaction to a medicine. If you ever feel like you may hurt yourself or others, or have thoughts about taking your own life, get help right away. You can go to the nearest emergency department or call:  Your local emergency services (911 in the [place]  A suicide crisis helpline, such as the National Suicide Prevention Lifeline at (332) 501-0618. This is open 24 hours a day. Summary  ADHD is a mental health disorder that starts during childhood (neurodevelopmental disorder) and often continues into the adult years.  The exact cause of ADHD is not known. Most experts believe genetics and environmental factors contribute to ADHD.  There is no cure for ADHD, but treatment with medicine, cognitive behavioral therapy, or behavioral management can help you manage your condition. This information is not intended to replace advice given to you by your health care provider. Make sure you discuss any questions you have with your health care provider. Document Revised: 03/07/2019 Document Reviewed: 03/07/2019 Elsevier Patient Education  2021 Elsevier Inc.  Mania Mania is a condition that affects people who have certain mood disorders. Mania involves episodes of emotional highs that include having very high energy, racing thoughts, very high self-esteem, and decreased ability to concentrate. These episodes are very intense and can last longer than a week. In some cases, episodes of mania can be so strong that people with this condition need to be hospitalized for their safety and the safety of people around them. What are the causes? The cause of this condition is not known. What increases the risk? You are more likely to develop mania if you have a mood disorder, especially bipolar disorder. If you have a mood  disorder, the following factors may increase your risk of developing mania:  Not getting enough sleep.  Using substances such as tobacco, caffeine, or illegal drugs.  Certain prescription medicines, such as antidepressants or antibiotics.  Stress or emotional events.  Certain seasons. Mania is more common in spring and summer.  The period of time after having a baby (postpartum period). What are the signs or symptoms? Symptoms of this condition include:  Periods of having very high energy that may last longer than a week. In some cases, you have so much energy that you may become unsafe and need to go to the hospital.  Very high self-esteem or self-confidence.  Decreased need for sleep.  Being unusually talkative, or feeling a need to keep talking. Speech may be very fast. It may seem like you cannot stop talking.  Racing thoughts or  constant talking, with quick shifts between topics that may or may not be related (flight of ideas).  Decreased ability to focus or concentrate.  Increased purposeful activity, such as work, study, or social activity.  Increased nonproductive activity. This could be pacing, squirming and fidgeting, or finger and toe tapping.  Impulsive behavior and poor judgment. This may result in high-risk activities, such as having unprotected sex or spending a lot of money.  Having false beliefs (delusions) or seeing, hearing, or feeling things that do not exist (hallucinations). How is this diagnosed? This condition may be diagnosed based on:  Your symptoms and medical history.  A physical exam. Your health care provider will check for physical conditions that may be causing your symptoms.  A mental health evaluation. You may be referred to a mental health provider who specializes in diagnosing and treating mood disorders. How is this treated? This condition may be treated with:  Medicines, such as mood stabilizers.  Talk therapy (psychotherapy) with  a mental health provider.  A procedure to change the brain chemicals that send messages between brain cells (neurotransmitters). This procedure, called electroconvulsive therapy (ECT), applies short electrical pulses to the brain through the scalp. This may be used in cases of severe mania when other treatments have not helped.   Follow these instructions at home:  Take over-the-counter and prescription medicines only as told by your health care provider.  Try to go to sleep and wake up at the same time every day.  Make and follow a routine for daily meal times.  Ask for support from family, friends, or relatives to make sure you stay on track with your treatment.  Keep all follow-up visits as told by your health care provider. This is important. Contact a health care provider if:  You have concerns about your treatment.  You have side effects from your prescription medicines.  Your symptoms do not improve or they get worse.  Your mania may be putting your health, or others' health, at risk. Get help right away if:  You think about hurting yourself or you try to hurt yourself.  You think about suicide. If you ever feel like you may hurt yourself or others, or have thoughts about taking your own life, get help right away. You can go to your nearest emergency department or call:  Your local emergency services (911 in the [place]  A suicide crisis helpline, such as the National Suicide Prevention Lifeline at 657-209-7503. This is open 24 hours a day. Summary  Mania involves episodes of emotional highs that include having very high energy, racing thoughts, very high self-esteem, and decreased ability to concentrate.  Episodes of mania are very intense and can last longer than a week.  Treatment for mania may include medicines and talk therapy (psychotherapy). This information is not intended to replace advice given to you by your health care provider. Make sure you discuss any  questions you have with your health care provider. Document Revised: 03/28/2020 Document Reviewed: 03/28/2020 Elsevier Patient Education  2021 [employer].

## 2021-01-10 NOTE — Progress Notes (Signed)
I connected with  Jordan Fowler on 01/10/21 at  3:15 PM EDT by Mychart Virtual video visit  and verified that I am speaking with the correct person using two identifiers.   I discussed the limitations, risks, security and privacy concerns of performing an evaluation and management service by telephone and the availability of in person appointments. I also discussed with the patient that there may be a patient responsible charge related to this service. The patient expressed understanding and agreed to proceed.  Guy Begin, CMA 01/10/2021  3:32 PM

## 2021-01-11 NOTE — Progress Notes (Signed)
OBSTETRICS PRENATAL VIRTUAL VISIT ENCOUNTER NOTE  Provider location: [facility]   I connected with Jordan Fowler on 01/11/21 at  3:15 PM EDT by MyChart Video Encounter at home and verified that I am speaking with the correct person using two identifiers.   I discussed the limitations, risks, security and privacy concerns of performing an evaluation and management service virtually and the availability of in person appointments. I also discussed with the patient that there may be a patient responsible charge related to this service. The patient expressed understanding and agreed to proceed. Subjective:  Jordan Fowler is a 30 y.o. [phone] at 32w1dbeing seen today for ongoing prenatal care.  She is currently monitored for the following issues for this low-risk pregnancy and has Gastroesophageal reflux disease; Supervision of low-risk pregnancy, second trimester; and Need for prophylactic vaccination with measles-mumps-rubella (MMR) vaccine on their problem list.  Patient reports having some mild swelling in her feet/ankles if she stands for too long, otherwise doing physically well. Has been seeing JRoselyn Reeffor a few visits, verbalizes increasing depression/anxiety. She has previously taken fluoxetine but did not like how flat it made her feel too flat. 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 This is very upsetting to her and she has expressed wanting to be on an antidepressant.  Contractions: Not present. Vag. Bleeding: None.  Movement: Present. Denies any leaking of fluid.   The following portions of the patient's history were reviewed and updated as appropriate: allergies, current medications, past family history, past medical history, past social history, past surgical history and problem list.   Objective:   Vitals:   01/10/21 1535  BP: (!) 103/58  Pulse: 78    Fetal Status:     Movement: Present      General:  Alert, oriented and cooperative. Patient is in no acute distress.  Respiratory: Normal respiratory effort, no problems with respiration noted  Mental Status: Normal mood and affect. Normal behavior. Normal judgment and thought content.  Rest of physical exam deferred due to type of encounter  Imaging: No results found.  Assessment and Plan:  Pregnancy: [id number] 329w1d. Encounter for supervision of low-risk pregnancy in third trimester - Doing physically well, feeling fetal movement   2. [redacted] weeks gestation of pregnancy - Anticipatory guidance given re next visits - will do in-person in 3wks, pt has had mostly virtual visits  3. Adult ADHD - Provided validation, encouraged her to research adult ADHD and coping skills. Suggested replacement stimming activities that do not cause physical harm.  4. Anxiety and depression - [facility] known to work well for people with anx/dep/ADHD. Pt expressed relief - buPROPion (WELLBUTRIN XL) 150 MG 24 hr tablet; Take 1 tablet (150 mg total) by mouth daily.  Dispense: 30 tablet; Refill: 3 - Strong precautions for mania given, pt expressed understanding. Encouraged to call if symptoms present, will check in 3wks at F/U visit  Preterm labor symptoms and general obstetric precautions including but not limited to vaginal bleeding, contractions, leaking of fluid and fetal movement were reviewed in detail with the patient. I discussed the assessment and treatment plan with the patient. The patient was provided an opportunity to ask questions and all were answered. The patient agreed with the plan and demonstrated an understanding of the instructions. The patient was advised to call back or seek an in-person office evaluation/[facility]  [place] for any urgent or concerning symptoms. Please refer to After Visit Summary for other counseling recommendations.   I provided  15 minutes of face-to-face time during this encounter.  Return in about 3 weeks (around 01/31/2021) for IN-PERSON, LOB.  Future Appointments  Date Time Provider [place]  01/23/2021  9:45 AM [place], [place] [facility] [place]

## 2021-01-18 ENCOUNTER — Other Ambulatory Visit: Payer: Self-pay

## 2021-01-18 ENCOUNTER — Inpatient Hospital Stay (HOSPITAL_COMMUNITY)
Admission: AD | Admit: 2021-01-18 | Discharge: 2021-01-18 | Disposition: A | Discharge status: Home / Self Care | Payer: Medicaid Other | Attending: Family Medicine | Admitting: Family Medicine

## 2021-01-18 ENCOUNTER — Encounter (HOSPITAL_COMMUNITY): Payer: Self-pay | Admitting: Family Medicine

## 2021-01-18 DIAGNOSIS — Z3492 Encounter for supervision of normal pregnancy, unspecified, second trimester: Secondary | ICD-10-CM

## 2021-01-18 DIAGNOSIS — R109 Unspecified abdominal pain: Secondary | ICD-10-CM | POA: Diagnosis not present

## 2021-01-18 DIAGNOSIS — Z3A32 32 weeks gestation of pregnancy: Secondary | ICD-10-CM | POA: Diagnosis not present

## 2021-01-18 DIAGNOSIS — Z79899 Other long term (current) drug therapy: Secondary | ICD-10-CM | POA: Diagnosis not present

## 2021-01-18 DIAGNOSIS — O26893 Other specified pregnancy related conditions, third trimester: Secondary | ICD-10-CM | POA: Insufficient documentation

## 2021-01-18 DIAGNOSIS — O212 Late vomiting of pregnancy: Secondary | ICD-10-CM | POA: Diagnosis present

## 2021-01-18 DIAGNOSIS — Z87891 Personal history of nicotine dependence: Secondary | ICD-10-CM | POA: Diagnosis not present

## 2021-01-18 DIAGNOSIS — O219 Vomiting of pregnancy, unspecified: Secondary | ICD-10-CM

## 2021-01-18 LAB — CBC WITH DIFFERENTIAL/PLATELET
Abs Immature Granulocytes: 0.1 10*3/uL — ABNORMAL HIGH (ref 0.00–0.07)
Basophils Absolute: 0.1 10*3/uL (ref 0.0–0.1)
Basophils Relative: 1 %
Eosinophils Absolute: 0.1 10*3/uL (ref 0.0–0.5)
Eosinophils Relative: 1 %
HCT: 34.2 % — ABNORMAL LOW (ref 36.0–46.0)
Hemoglobin: 11.2 g/dL — ABNORMAL LOW (ref 12.0–15.0)
Immature Granulocytes: 1 %
Lymphocytes Relative: 15 %
Lymphs Abs: 1.6 10*3/uL (ref 0.7–4.0)
MCH: 32.5 pg (ref 26.0–34.0)
MCHC: 32.7 g/dL (ref 30.0–36.0)
MCV: 99.1 fL (ref 80.0–100.0)
Monocytes Absolute: 1.1 10*3/uL — ABNORMAL HIGH (ref 0.1–1.0)
Monocytes Relative: 10 %
Neutro Abs: 7.7 10*3/uL (ref 1.7–7.7)
Neutrophils Relative %: 72 %
Platelets: 294 10*3/uL (ref 150–400)
RBC: 3.45 MIL/uL — ABNORMAL LOW (ref 3.87–5.11)
RDW: 12.2 % (ref 11.5–15.5)
WBC: 10.7 10*3/uL — ABNORMAL HIGH (ref 4.0–10.5)
nRBC: 0 % (ref 0.0–0.2)

## 2021-01-18 LAB — BASIC METABOLIC PANEL
Anion gap: 11 (ref 5–15)
BUN: 7 mg/dL (ref 6–20)
CO2: 18 mmol/L — ABNORMAL LOW (ref 22–32)
Calcium: 8.8 mg/dL — ABNORMAL LOW (ref 8.9–10.3)
Chloride: 104 mmol/L (ref 98–111)
Creatinine, Ser: 0.6 mg/dL (ref 0.44–1.00)
GFR, Estimated: >60 mL/min (ref >60)
Glucose, Bld: 80 mg/dL (ref 70–99)
Potassium: 3.7 mmol/L (ref 3.5–5.1)
Sodium: 133 mmol/L — ABNORMAL LOW (ref 135–145)

## 2021-01-18 LAB — URINALYSIS, ROUTINE W REFLEX MICROSCOPIC
Bilirubin Urine: NEGATIVE
Glucose, UA: NEGATIVE mg/dL
Hgb urine dipstick: NEGATIVE
Ketones, ur: 80 mg/dL — AB
Leukocytes,Ua: NEGATIVE
Nitrite: NEGATIVE
Protein, ur: 30 mg/dL — AB
Specific Gravity, Urine: 1.026 (ref 1.005–1.030)
pH: 6 (ref 5.0–8.0)

## 2021-01-18 MED ORDER — THIAMINE HCL 100 MG/ML IJ SOLN
Freq: Once | INTRAVENOUS | Status: DC
  Filled 2021-01-18: qty 1000

## 2021-01-18 MED ORDER — PROMETHAZINE HCL 25 MG PO TABS: 12.5000 mg | ORAL_TABLET | Freq: Four times a day (QID) | ORAL | 0 refills | Status: DC | PRN

## 2021-01-18 MED ORDER — ONDANSETRON 4 MG PO TBDP: 4.0000 mg | ORAL_TABLET | Freq: Three times a day (TID) | ORAL | 2 refills | Status: DC | PRN

## 2021-01-18 MED ORDER — LACTATED RINGERS IV BOLUS
1000.0000 mL | Freq: Once | INTRAVENOUS | Status: AC
  Administered 2021-01-18: 1000 mL via INTRAVENOUS

## 2021-01-18 MED ORDER — FAMOTIDINE IN NACL 20-0.9 MG/50ML-% IV SOLN
20.0000 mg | Freq: Once | INTRAVENOUS | Status: AC
  Administered 2021-01-18: 20 mg via INTRAVENOUS
  Filled 2021-01-18: qty 50

## 2021-01-18 MED ORDER — PROMETHAZINE HCL 25 MG/ML IJ SOLN
25.0000 mg | Freq: Once | INTRAVENOUS | Status: AC
  Administered 2021-01-18: 25 mg via INTRAVENOUS
  Filled 2021-01-18: qty 1

## 2021-01-18 MED ORDER — ONDANSETRON HCL 4 MG/2ML IJ SOLN
4.0000 mg | Freq: Once | INTRAMUSCULAR | Status: AC
  Administered 2021-01-18: 4 mg via INTRAVENOUS
  Filled 2021-01-18: qty 2

## 2021-01-18 NOTE — MAU Note (Signed)
Jordan Fowler is a 30 y.o. at [redacted]w[redacted]d here in MAU reporting: has been nauseated for the past 2 days. States constant emesis. Unable to keep down fluids. No diarrhea. Currently has a dull left lower abdominal pain. No bleeding or LOF. +FM  Onset of complaint: 2 days  Pain score: 2/10  Vitals:   01/18/21 1749  BP: 120/63  Pulse: (!) 102  Resp: 18  Temp: 98 F (36.7 C)  SpO2: 100%     FHT: +FM  Lab orders placed from triage: UA

## 2021-01-18 NOTE — MAU Provider Note (Addendum)
History     CSN: [id number]  Arrival date and time: 01/18/21 1729   None    Chief Complaint  Patient presents with  . Nausea  . Emesis  . Abdominal Pain   HPI  Patient is a [id number] by u/s 30yof presenting with N/V. She states that she has had nausea her whole pregnancy and has been prescribed zofran prn. The nausea has gotten much worse over the past two days to where she states she cannot keep anything down. She mentions that she ran out of her zofran two days ago. She notes her last full meal she could keep down was two days ago in the morning. Denies hematemesis, diarrhea, chest pain and fever. Denies complications with this pregnancy.   OB History    Gravida  7   Para  3   Term  3   Preterm  0   AB  3   Living  3     SAB  1   IAB  2   Ectopic  0   Multiple  0   Live Births  3           Past Medical History:  Diagnosis Date  . Anxiety   . Chlamydia 2008  . Depression   . Late prenatal care affecting pregnancy in second trimester   . Limited prenatal care in third trimester 06/16/2016  . Normal pregnancy 08/17/2012  . SVD (spontaneous vaginal delivery) 08/17/2012  . SVD (spontaneous vaginal delivery) 06/18/2016  . SVD (spontaneous vaginal delivery) 06/18/2016    Past Surgical History:  Procedure Laterality Date  . DILATION AND CURETTAGE OF UTERUS    . NO PAST SURGERIES      Family History  Problem Relation Age of Onset  . Diabetes Maternal Grandmother   . Diabetes Paternal Grandmother     Social History   Tobacco Use  . Smoking status: Former Smoker    Packs/day: 0.50    Quit date: 06/27/2020    Years since quitting: 0.5  . Smokeless tobacco: Never Used  Vaping Use  . Vaping Use: Never used  Substance Use Topics  . Alcohol use: No  . Drug use: No    Allergies: No Known Allergies  Medications Prior to Admission  Medication Sig Dispense Refill Last Dose  . buPROPion (WELLBUTRIN XL) 150 MG 24 hr tablet Take 1 tablet (150 mg  total) by mouth daily. 30 tablet 3 01/17/2021 at Unknown time  . famotidine (PEPCID) 40 MG tablet Take 1 tablet (40 mg total) by mouth daily. 30 tablet 11 01/17/2021 at Unknown time  . Prenatal Vit-Fe Fumarate-FA (PRENATAL PO) Take by mouth.   01/17/2021 at Unknown time  . acetaminophen (TYLENOL) 500 MG tablet Take 2 tablets (1,000 mg total) by mouth every 8 (eight) hours. 30 tablet 0   . dimenhyDRINATE (DRAMAMINE) 50 MG tablet Take 50 mg by mouth every 8 (eight) hours as needed. (Patient not taking: No sig reported)     . [DISCONTINUED] ondansetron (ZOFRAN ODT) 4 MG disintegrating tablet Take 1 tablet (4 mg total) by mouth every 8 (eight) hours as needed for nausea or vomiting. 30 tablet 2     Review of Systems  Constitutional: Negative for fever.  Respiratory: Positive for shortness of breath.   Cardiovascular: Negative for chest pain.  Gastrointestinal: Positive for abdominal pain, nausea and vomiting. Negative for diarrhea.       Denies hematemesis  Genitourinary: Negative for hematuria.   Physical Exam   Blood  pressure 120/63, pulse (!) 102, temperature 98 F (36.7 C), temperature source Oral, resp. rate 18, height 5\' 4"  (1.626 m), weight 77.2 kg, last menstrual period 06/18/2020, SpO2 100 %, unknown if currently breastfeeding.  Physical Exam Vitals and nursing note reviewed.  HENT:     Head: Normocephalic.  Cardiovascular:     Rate and Rhythm: Normal rate and regular rhythm.  Pulmonary:     Effort: Pulmonary effort is normal.  Abdominal:     Tenderness: There is abdominal tenderness in the epigastric area.  Skin:    General: Skin is warm and dry.  Neurological:     General: No focal deficit present.     Mental Status: She is alert and oriented to person, place, and time.  Psychiatric:        Mood and Affect: Mood normal.        Behavior: Behavior normal.     MAU Course  Procedures  Results for orders placed or performed during the hospital encounter of 01/18/21 (from  the past 72 hour(s))  Urinalysis, Routine w reflex microscopic Urine, Clean Catch     Status: Abnormal   Collection Time: 01/18/21  6:11 PM  Result Value Ref Range   Color, Urine AMBER (A) YELLOW    Comment: BIOCHEMICALS MAY BE AFFECTED BY COLOR   APPearance CLEAR CLEAR   Specific Gravity, Urine 1.026 1.005 - 1.030   pH 6.0 5.0 - 8.0   Glucose, UA NEGATIVE NEGATIVE mg/dL   Hgb urine dipstick NEGATIVE NEGATIVE   Bilirubin Urine NEGATIVE NEGATIVE   Ketones, ur 80 (A) NEGATIVE mg/dL   Protein, ur 30 (A) NEGATIVE mg/dL   Nitrite NEGATIVE NEGATIVE   Leukocytes,Ua NEGATIVE NEGATIVE   RBC / HPF 0-5 0 - 5 RBC/hpf   WBC, UA 0-5 0 - 5 WBC/hpf   Bacteria, UA RARE (A) NONE SEEN   Squamous Epithelial / LPF 0-5 0 - 5   Mucus PRESENT     Comment: Performed at [facility], [address] [place], [place]  CBC with Differential/Platelet     Status: Abnormal   Collection Time: 01/18/21  6:33 PM  Result Value Ref Range   WBC 10.7 (H) 4.0 - 10.5 K/uL   RBC 3.45 (L) 3.87 - 5.11 MIL/uL   Hemoglobin 11.2 (L) 12.0 - 15.0 g/dL   HCT 01/20/21 (L) 99.7 - 74.1 %   MCV 99.1 80.0 - 100.0 fL   MCH 32.5 26.0 - 34.0 pg   MCHC 32.7 30.0 - 36.0 g/dL   RDW 42.3 95.3 - 20.2 %   Platelets 294 150 - 400 K/uL   nRBC 0.0 0.0 - 0.2 %   Neutrophils Relative % 72 %   Neutro Abs 7.7 1.7 - 7.7 K/uL   Lymphocytes Relative 15 %   Lymphs Abs 1.6 0.7 - 4.0 K/uL   Monocytes Relative 10 %   Monocytes Absolute 1.1 (H) 0.1 - 1.0 K/uL   Eosinophils Relative 1 %   Eosinophils Absolute 0.1 0.0 - 0.5 K/uL   Basophils Relative 1 %   Basophils Absolute 0.1 0.0 - 0.1 K/uL   Immature Granulocytes 1 %   Abs Immature Granulocytes 0.10 (H) 0.00 - 0.07 K/uL    Comment: Performed at [facility], [address] [place], [place]  Basic metabolic panel     Status: Abnormal   Collection Time: 01/18/21  6:33 PM  Result Value Ref Range   Sodium 133 (L) 135 - 145  mmol/L   Potassium 3.7 3.5 - 5.1 mmol/L    Chloride 104 98 - 111 mmol/L   CO2 18 (L) 22 - 32 mmol/L   Glucose, Bld 80 70 - 99 mg/dL    Comment: Glucose reference range applies only to samples taken after fasting for at least 8 hours.   BUN 7 6 - 20 mg/dL   Creatinine, Ser 1.19 0.44 - 1.00 mg/dL   Calcium 8.8 (L) 8.9 - 10.3 mg/dL   GFR, Estimated >41 >74 mL/min    Comment: (NOTE) Calculated using the CKD-EPI Creatinine Equation (2021)    Anion gap 11 5 - 15    Comment: Performed at [facility], [address], [place], [place] [address]    MDM [phone] [redacted]w[redacted]d by u/s 30yof presenting with N/V. She is sitting up in her hospital bed. She is complaining of her nausea. BMP and CBC ordered. UA is significant for ketones. Most likely indicative of dehydration. Lactated ringer and pepcid given. Phenergan given. Vitamin bag ordered. Will recheck how she feels post antiemetic medication and fluids.    Assessment and Plan   Nausea/Vomiting  -- Most likely dehydrated  -- Fluids and antiemetics given  -- PO challenge  -- If symptoms worsen, come back to the MAU or local ER  [redacted] week gestation pregnancy  -- F/u with outpatient OB  Sharen Counter, PA Student 01/18/2021, 8:11 PM   CNM attestation:  I have seen and examined this patient and agree with above documentation in the PA student's note.   Fedra Lanter is a 30 y.o. [phone] at [redacted]w[redacted]d reporting worsening n/v and she is out of her prescribed Zofran.  +FM, denies LOF, VB, contractions, vaginal discharge.  PE: Patient Vitals for the past 24 hrs:  BP Temp Temp src Pulse Resp SpO2 Height Weight  01/18/21 1749 120/63 98 F (36.7 C) Oral (!) 102 18 100 % - -  01/18/21 1745 - - - - - - 5\' 4"  (1.626 m) 77.2 kg   Gen: calm comfortable, NAD Resp: normal effort, no distress Heart: Regular rate Abd: Soft, NT, gravid, S=D  FHR: Baseline 145, moderate Variability, 10 x 10 accels only, variable x 1 lasting 50 seconds down to 80s CTX: none on toco or to palpation  ROS, labs,  PMH reviewed  Orders Placed This Encounter  Procedures  . Urinalysis, Routine w reflex microscopic Urine, Clean Catch  . CBC with Differential/Platelet  . Basic metabolic panel   Meds ordered this encounter  Medications  . lactated ringers bolus 1,000 mL  . famotidine (PEPCID) IVPB 20 mg premix  . promethazine (PHENERGAN) 25 mg in lactated ringers 1,000 mL infusion  . DISCONTD: sodium chloride 0.9 % 1,000 mL with thiamine 100 mg, folic acid 1 mg, multivitamins adult 10 mL infusion  . promethazine (PHENERGAN) 25 MG tablet    Sig: Take 0.5-1 tablets (12.5-25 mg total) by mouth every 6 (six) hours as needed for nausea.    Dispense:  30 tablet    Refill:  0    Order Specific Question:   Supervising Provider    Answer:   CONSTANT, PEGGY [[id number]]  . ondansetron (ZOFRAN ODT) 4 MG disintegrating tablet    Sig: Take 1 tablet (4 mg total) by mouth every 8 (eight) hours as needed for nausea or vomiting.    Dispense:  30 tablet    Refill:  2    Order Specific Question:   Supervising Provider    Answer:   CONSTANT,  PEGGY [[id number]]  . ondansetron (ZOFRAN) injection 4 mg    MDM Pt improved after IV fluids/antiemetics. Tolerated PO.  D/C home with Rx for Phenergan and Zofran.  Pt to f/u if symptoms persist/worsen.  Assessment: 1. Nausea and vomiting during pregnancy   2. Supervision of low-risk pregnancy, second trimester   3. [redacted] weeks gestation of pregnancy     Plan: - Discharge home in stable condition. - Preterm labor precautions and fetal kick counts. - Follow-up as scheduled at your doctor's office for next prenatal visit or sooner as needed if symptoms worsen. - Return to maternity admissions symptoms worsen  Kendell Bane 01/18/2021 8:11 PM

## 2021-01-21 ENCOUNTER — Encounter: Payer: Self-pay | Admitting: *Deleted

## 2021-01-23 ENCOUNTER — Ambulatory Visit: Payer: Medicaid Other | Admitting: Clinical

## 2021-01-23 DIAGNOSIS — Z91199 Patient's noncompliance with other medical treatment and regimen due to unspecified reason: Secondary | ICD-10-CM

## 2021-01-23 DIAGNOSIS — Z5329 Procedure and treatment not carried out because of patient's decision for other reasons: Secondary | ICD-10-CM

## 2021-01-29 ENCOUNTER — Encounter: Payer: Self-pay | Admitting: *Deleted

## 2021-02-06 ENCOUNTER — Telehealth: Payer: Self-pay

## 2021-02-06 ENCOUNTER — Ambulatory Visit (INDEPENDENT_AMBULATORY_CARE_PROVIDER_SITE_OTHER): Payer: Medicaid Other | Admitting: Certified Nurse Midwife

## 2021-02-06 ENCOUNTER — Other Ambulatory Visit: Payer: Self-pay

## 2021-02-06 VITALS — BP 102/68 | HR 90

## 2021-02-06 DIAGNOSIS — Z3A34 34 weeks gestation of pregnancy: Secondary | ICD-10-CM

## 2021-02-06 DIAGNOSIS — Z23 Encounter for immunization: Secondary | ICD-10-CM

## 2021-02-06 DIAGNOSIS — Z3492 Encounter for supervision of normal pregnancy, unspecified, second trimester: Secondary | ICD-10-CM | POA: Diagnosis not present

## 2021-02-06 DIAGNOSIS — F909 Attention-deficit hyperactivity disorder, unspecified type: Secondary | ICD-10-CM

## 2021-02-06 NOTE — Progress Notes (Signed)
   PRENATAL VISIT NOTE  Subjective:  Jordan Fowler is a 30 y.o. [phone] at [redacted]w[redacted]d being seen today for ongoing prenatal care.  She is currently monitored for the following issues for this low-risk pregnancy and has Gastroesophageal reflux disease; Supervision of low-risk pregnancy, second trimester; and Need for prophylactic vaccination with measles-mumps-rubella (MMR) vaccine on their problem list.  Patient reports no physical complaints, feeling better since her MAU visit. Wellbutrin beginning to work, not crying or pulling hair as often.  Contractions: Irritability. Vag. Bleeding: None.  Movement: Present. Denies leaking of fluid.   The following portions of the patient's history were reviewed and updated as appropriate: allergies, current medications, past family history, past medical history, past social history, past surgical history and problem list.   Objective:   Vitals:   02/06/21 1038  BP: 102/68  Pulse: 90    Fetal Status: Fetal Heart Rate (bpm): 136 Fundal Height: 35 cm Movement: Present     General:  Alert, oriented and cooperative. Patient is in no acute distress.  Skin: Skin is warm and dry. No rash noted.   Cardiovascular: Normal heart rate noted  Respiratory: Normal respiratory effort, no problems with respiration noted  Abdomen: Soft, gravid, appropriate for gestational age.  Pain/Pressure: Present     Pelvic: Cervical exam deferred        Extremities: Normal range of motion.  Edema: None  Mental Status: Normal mood and affect. Normal behavior. Normal judgment and thought content.   Assessment and Plan:  Pregnancy: [id number] at [redacted]w[redacted]d 1. Supervision of low-risk pregnancy, second trimester - Doing well overall, feeling plenty of vigorours fetal movement  2. [redacted] weeks gestation of pregnancy - Routine OB care - Pt did not come to GTT visit, fasting today but cannot stay for testing. Will reschedule appt, if pt does not come will draw CMP and A1C at next visit - Tdap  vaccine greater than or equal to 7yo IM  3. Adult ADHD - Wellbutrin improving symptoms slowly - Reviewed alternatives to hair pulling as a stimming behavior again, encouraged pt to try a few of the fidget devices  - Discussed general coping methods for parenting with ADHD, provided validation and encouragement  Preterm labor symptoms and general obstetric precautions including but not limited to vaginal bleeding, contractions, leaking of fluid and fetal movement were reviewed in detail with the patient. Please refer to After Visit Summary for other counseling recommendations.   Return in about 2 weeks (around 02/20/2021) for IN-PERSON, LOB w GBS.  Future Appointments  Date Time Provider [place]  02/20/2021  1:35 PM Gabriel Carina, CNM [facility]    Gabriel Carina, CNM

## 2021-02-06 NOTE — Telephone Encounter (Signed)
Attempt to call patient 2x to schedule her a lab appointment for her 2 hours GTT & Labs.  No answer and LVM. Will send a MyChart Message to reach patient.    02/06/2021 [name] Y/ CMA.

## 2021-02-20 ENCOUNTER — Other Ambulatory Visit (HOSPITAL_COMMUNITY)
Admission: RE | Admit: 2021-02-20 | Discharge: 2021-02-20 | Disposition: A | Discharge status: Home / Self Care | Payer: Medicaid Other | Source: Ambulatory Visit | Attending: Certified Nurse Midwife | Admitting: Certified Nurse Midwife

## 2021-02-20 ENCOUNTER — Ambulatory Visit (INDEPENDENT_AMBULATORY_CARE_PROVIDER_SITE_OTHER): Payer: Medicaid Other | Admitting: Certified Nurse Midwife

## 2021-02-20 VITALS — BP 112/72 | HR 90 | Wt 183.2 lb

## 2021-02-20 DIAGNOSIS — Z3A36 36 weeks gestation of pregnancy: Secondary | ICD-10-CM

## 2021-02-20 DIAGNOSIS — Z3403 Encounter for supervision of normal first pregnancy, third trimester: Secondary | ICD-10-CM

## 2021-02-20 NOTE — Progress Notes (Signed)
Pressure on pelvic but stated that " it isn't anything alarming". Patient stated that she had some vaginal discharge that started in the beginning of last week and lasted 2 days. Discharge was on and off.

## 2021-02-21 LAB — GC/CHLAMYDIA PROBE AMP (~~LOC~~) NOT AT ARMC
Chlamydia: NEGATIVE
Comment: NEGATIVE
Comment: NORMAL
Neisseria Gonorrhea: NEGATIVE

## 2021-02-21 NOTE — Progress Notes (Signed)
   PRENATAL VISIT NOTE  Subjective:  Jordan Fowler is a 30 y.o. [phone] at [redacted]w[redacted]d being seen today for ongoing prenatal care.  She is currently monitored for the following issues for this low-risk pregnancy and has Gastroesophageal reflux disease; Supervision of low-risk pregnancy, second trimester; Need for prophylactic vaccination with measles-mumps-rubella (MMR) vaccine; and Adult ADHD on their problem list.  Patient reports no complaints.  Contractions: Irritability. Vag. Bleeding: None.  Movement: Present. Denies leaking of fluid.   The following portions of the patient's history were reviewed and updated as appropriate: allergies, current medications, past family history, past medical history, past social history, past surgical history and problem list.   Objective:   Vitals:   02/20/21 1352  BP: 112/72  Pulse: 90  Weight: 183 lb 3.2 oz (83.1 kg)    Fetal Status: Fetal Heart Rate (bpm): 142 Fundal Height: 37 cm Movement: Present     General:  Alert, oriented and cooperative. Patient is in no acute distress.  Skin: Skin is warm and dry. No rash noted.   Cardiovascular: Normal heart rate noted  Respiratory: Normal respiratory effort, no problems with respiration noted  Abdomen: Soft, gravid, appropriate for gestational age.  Pain/Pressure: Present     Pelvic: Cervical exam deferred        Extremities: Normal range of motion.  Edema: None  Mental Status: Normal mood and affect. Normal behavior. Normal judgment and thought content.   Assessment and Plan:  Pregnancy: [id number] at [redacted]w[redacted]d 1. Encounter for supervision of low-risk first pregnancy in third trimester - Doing well, having some occasional pressure with walking. Had some increased vaginal discharge but it has stopped - still noting small improvements of mood and stimming behaviors with Wellbutrin  2. [redacted] weeks gestation of pregnancy - Routine OB care - GC/Chlamydia probe amp ([place])not at [facility] - Culture, beta strep  (group b only)  Preterm labor symptoms and general obstetric precautions including but not limited to vaginal bleeding, contractions, leaking of fluid and fetal movement were reviewed in detail with the patient. Please refer to After Visit Summary for other counseling recommendations.   Return in about 1 week (around 02/27/2021) for IN-PERSON, LOB.  Future Appointments  Date Time Provider [place]  02/27/2021  3:15 PM Gabriel Carina, CNM [facility]    Gabriel Carina, CNM

## 2021-02-24 LAB — CULTURE, BETA STREP (GROUP B ONLY): Strep Gp B Culture: NEGATIVE

## 2021-02-27 ENCOUNTER — Ambulatory Visit (INDEPENDENT_AMBULATORY_CARE_PROVIDER_SITE_OTHER): Payer: Medicaid Other | Admitting: Certified Nurse Midwife

## 2021-02-27 ENCOUNTER — Other Ambulatory Visit: Payer: Self-pay

## 2021-02-27 VITALS — BP 99/61 | HR 108 | Wt 189.9 lb

## 2021-02-27 DIAGNOSIS — Z3A37 37 weeks gestation of pregnancy: Secondary | ICD-10-CM

## 2021-02-27 DIAGNOSIS — Z3493 Encounter for supervision of normal pregnancy, unspecified, third trimester: Secondary | ICD-10-CM

## 2021-02-27 DIAGNOSIS — M546 Pain in thoracic spine: Secondary | ICD-10-CM

## 2021-02-27 MED ORDER — CYCLOBENZAPRINE HCL 10 MG PO TABS: 10.0000 mg | ORAL_TABLET | Freq: Three times a day (TID) | ORAL | 1 refills | Status: AC | PRN

## 2021-02-27 NOTE — Progress Notes (Signed)
   PRENATAL VISIT NOTE  Subjective:  Jordan Fowler is a 30 y.o. [phone] at [redacted]w[redacted]d being seen today for ongoing prenatal care.  She is currently monitored for the following issues for this low-risk pregnancy and has Gastroesophageal reflux disease; Supervision of low-risk pregnancy, second trimester; Need for prophylactic vaccination with measles-mumps-rubella (MMR) vaccine; and Adult ADHD on their problem list.  Patient reports left-sided rib and back pain she thinks is due to the baby's position.  Contractions: Irritability. Vag. Bleeding: None.  Movement: Present. Denies leaking of fluid.   The following portions of the patient's history were reviewed and updated as appropriate: allergies, current medications, past family history, past medical history, past social history, past surgical history and problem list.   Objective:   Vitals:   02/27/21 1539  BP: 99/61  Pulse: (!) 108  Weight: 189 lb 14.4 oz (86.1 kg)    Fetal Status:     Movement: Present     General:  Alert, oriented and cooperative. Patient is in no acute distress.  Skin: Skin is warm and dry. No rash noted.   Cardiovascular: Normal heart rate noted  Respiratory: Normal respiratory effort, no problems with respiration noted  Abdomen: Soft, gravid, appropriate for gestational age. Pinpoint tenderness on 3rd to last insertional point on rib (same place and type of tenderness on back)  Pain/Pressure: Present     Pelvic: Cervical exam deferred        Extremities: Normal range of motion.  Edema: None  Mental Status: Normal mood and affect. Normal behavior. Normal judgment and thought content.   Assessment and Plan:  Pregnancy: [id number] at [redacted]w[redacted]d 1. Supervision of low-risk pregnancy, third trimester - Doing well other than back pain, feeling vigorous fetal movement  2. [redacted] weeks gestation of pregnancy - Routine OB care  3. Acute left-sided thoracic back pain - Baby LOT by [facility] - cyclobenzaprine (FLEXERIL) 10 MG  tablet; Take 1 tablet (10 mg total) by mouth every 8 (eight) hours as needed for muscle spasms.  Dispense: 30 tablet; Refill: 1  Term labor symptoms and general obstetric precautions including but not limited to vaginal bleeding, contractions, leaking of fluid and fetal movement were reviewed in detail with the patient. Please refer to After Visit Summary for other counseling recommendations.   Return in about 1 week (around 03/06/2021) for IN-PERSON, LOB.  Future Appointments  Date Time Provider [place]  03/06/2021  3:55 PM Gabriel Carina, CNM [facility]    Gabriel Carina, CNM

## 2021-02-27 NOTE — Progress Notes (Signed)
Complains about mid back pain and rib (left side) pain that became "more noticeable" yesterday but denies any vaginal bleeding

## 2021-03-06 ENCOUNTER — Ambulatory Visit (INDEPENDENT_AMBULATORY_CARE_PROVIDER_SITE_OTHER): Payer: Medicaid Other | Admitting: Certified Nurse Midwife

## 2021-03-06 VITALS — BP 104/72 | HR 112 | Wt 189.0 lb

## 2021-03-06 DIAGNOSIS — K5903 Drug induced constipation: Secondary | ICD-10-CM

## 2021-03-06 DIAGNOSIS — Z3A38 38 weeks gestation of pregnancy: Secondary | ICD-10-CM

## 2021-03-06 DIAGNOSIS — Z3493 Encounter for supervision of normal pregnancy, unspecified, third trimester: Secondary | ICD-10-CM

## 2021-03-06 MED ORDER — DOCUSATE SODIUM 100 MG PO CAPS: 100.0000 mg | ORAL_CAPSULE | Freq: Two times a day (BID) | ORAL | 0 refills | Status: AC

## 2021-03-06 MED ORDER — POLYETHYLENE GLYCOL 3350 17 G PO PACK: 17.0000 g | PACK | Freq: Every day | ORAL | 0 refills | Status: AC

## 2021-03-06 NOTE — Patient Instructions (Signed)

## 2021-03-06 NOTE — Progress Notes (Signed)
Patient complains of constipation  

## 2021-03-06 NOTE — Progress Notes (Signed)
   PRENATAL VISIT NOTE  Subjective:  Jordan Fowler is a 30 y.o. [phone] at [redacted]w[redacted]d being seen today for ongoing prenatal care.  She is currently monitored for the following issues for this low-risk pregnancy and has Gastroesophageal reflux disease; Supervision of low-risk pregnancy, second trimester; Need for prophylactic vaccination with measles-mumps-rubella (MMR) vaccine; and Adult ADHD on their problem list.  Patient reports occasional contractions and constipation since yesterday due to zofran use.  Contractions: Irritability. Vag. Bleeding: None.  Movement: Present. Denies leaking of fluid.   The following portions of the patient's history were reviewed and updated as appropriate: allergies, current medications, past family history, past medical history, past social history, past surgical history and problem list.   Objective:   Vitals:   03/06/21 1612  BP: 104/72  Pulse: (!) 112  Weight: 189 lb (85.7 kg)    Fetal Status:     Movement: Present     General:  Alert, oriented and cooperative. Patient is in no acute distress.  Skin: Skin is warm and dry. No rash noted.   Cardiovascular: Normal heart rate noted  Respiratory: Normal respiratory effort, no problems with respiration noted  Abdomen: Soft, gravid, appropriate for gestational age.  Pain/Pressure: Present     Pelvic: Cervical exam deferred        Extremities: Normal range of motion.  Edema: None  Mental Status: Normal mood and affect. Normal behavior. Normal judgment and thought content.   Assessment and Plan:  Pregnancy: [id number] at [redacted]w[redacted]d 1. Supervision of low-risk pregnancy, third trimester - Doing well other than constipation. Feeling less large movements but plenty of vigorous smaller movements  2. [redacted] weeks gestation of pregnancy - Routine OB care   3. Drug-induced constipation - prescriptions for colace and miralax sent to pharmacy, encouraged increased water intake as well  Term labor symptoms and general  obstetric precautions including but not limited to vaginal bleeding, contractions, leaking of fluid and fetal movement were reviewed in detail with the patient. Please refer to After Visit Summary for other counseling recommendations.   Return in about 1 week (around 03/13/2021).  Future Appointments  Date Time Provider [place]  03/12/2021  2:00 PM [place], Rona Ravens, NP [facility]    Gabriel Carina, CNM

## 2021-03-12 ENCOUNTER — Encounter: Payer: Medicaid Other | Admitting: Nurse Practitioner

## 2021-03-13 ENCOUNTER — Ambulatory Visit (INDEPENDENT_AMBULATORY_CARE_PROVIDER_SITE_OTHER): Payer: Medicaid Other | Admitting: Certified Nurse Midwife

## 2021-03-13 ENCOUNTER — Other Ambulatory Visit: Payer: Self-pay | Admitting: Advanced Practice Midwife

## 2021-03-13 ENCOUNTER — Other Ambulatory Visit: Payer: Self-pay

## 2021-03-13 VITALS — BP 117/75 | HR 108 | Wt 186.8 lb

## 2021-03-13 DIAGNOSIS — Z3492 Encounter for supervision of normal pregnancy, unspecified, second trimester: Secondary | ICD-10-CM

## 2021-03-13 DIAGNOSIS — Z3A39 39 weeks gestation of pregnancy: Secondary | ICD-10-CM

## 2021-03-13 NOTE — Patient Instructions (Signed)
Labor Induction Labor induction is when steps are taken to cause a pregnant woman to begin the labor process. Most women go into labor on their own between 37 weeks and 42 weeks of pregnancy. When this does not happen, or when there is a medical need for labor to begin, steps may be taken to induce, or bring on, labor. Labor induction causes a pregnant woman's uterus to contract. It also causes the cervix to soften (ripen), open (dilate), and thin out. Usually, labor is not induced before 39 weeks of pregnancy unless there is a medical reason to do so. When is labor induction considered? Labor induction may be right for you if:  Your pregnancy lasts longer than 41 to 42 weeks.  Your placenta is separating from your uterus (placental abruption).  You have a rupture of membranes and your labor does not begin.  You have health problems, like diabetes or high blood pressure (preeclampsia) during your pregnancy.  Your baby has stopped growing or does not have enough amniotic fluid. Before labor induction begins, your health care provider will consider the following factors:  Your medical condition and the baby's condition.  How many weeks you have been pregnant.  How mature the baby's lungs are.  The condition of your cervix.  The position of the baby.  The size of your birth canal. Tell a health care provider about:  Any allergies you have.  All medicines you are taking, including vitamins, herbs, eye drops, creams, and over-the-counter medicines.  Any problems you or your family members have had with anesthetic medicines.  Any surgeries you have had.  Any blood disorders you have.  Any medical conditions you have. What are the risks? Generally, this is a safe procedure. However, problems may occur, including:  Failed induction.  Changes in fetal heart rate, such as being too high, too low, or irregular (erratic).  Infection in the mother or the baby.  Increased risk of  having a cesarean delivery.  Breaking off (abruption) of the placenta from the uterus. This is rare.  Rupture of the uterus. This is very rare.  Your baby could fail to get enough blood flow or oxygen. This can be life-threatening. When induction is needed for medical reasons, the benefits generally outweigh the risks. What happens during the procedure? During the procedure, your health care provider will use one of these methods to induce labor:  Stripping the membranes. In this method, the amniotic sac tissue is gently separated from the cervix. This causes the following to happen: ? Your cervix stretches, which in turn causes the release of prostaglandins. ? Prostaglandins induce labor and cause the uterus to contract. ? This procedure is often done in an office visit. You will be sent home to wait for contractions to begin.  Prostaglandin medicine. This medicine starts contractions and causes the cervix to dilate and ripen. This can be taken by mouth (orally) or by being inserted into the vagina (suppository).  Inserting a small, thin tube (catheter) with a balloon into the vagina and then expanding the balloon with water to dilate the cervix.  Breaking the water. In this method, a small instrument is used to make a small hole in the amniotic sac. This eventually causes the amniotic sac to break. Contractions should begin within a few hours.  Medicine to trigger or strengthen contractions. This medicine is given through an IV that is inserted into a vein in your arm. This procedure may vary among health care providers and hospitals.     Where to find more information  March of Dimes: www.marchofdimes.org  The American College of Obstetricians and Gynecologists: www.acog.org Summary  Labor induction causes a pregnant woman's uterus to contract. It also causes the cervix to soften (ripen), open (dilate), and thin out.  Labor is usually not induced before 39 weeks of pregnancy unless  there is a medical reason to do so.  When induction is needed for medical reasons, the benefits generally outweigh the risks.  Talk with your health care provider about which methods of labor induction are right for you. This information is not intended to replace advice given to you by your health care provider. Make sure you discuss any questions you have with your health care provider. Document Revised: 07/26/2020 Document Reviewed: 07/26/2020 Elsevier Patient Education  2021 Elsevier Inc.  

## 2021-03-14 ENCOUNTER — Telehealth (HOSPITAL_COMMUNITY): Payer: Self-pay | Admitting: *Deleted

## 2021-03-14 NOTE — Progress Notes (Signed)
   PRENATAL VISIT NOTE  Subjective:  Jordan Fowler is a 30 y.o. [phone] at [redacted]w[redacted]d being seen today for ongoing prenatal care.  She is currently monitored for the following issues for this low-risk pregnancy and has Gastroesophageal reflux disease; Supervision of low-risk pregnancy, second trimester; Need for prophylactic vaccination with measles-mumps-rubella (MMR) vaccine; and Adult ADHD on their problem list.  Patient reports no complaints. Has had a burst of energy and feeling good.  Contractions: Irritability. Vag. Bleeding: None.  Movement: Present. Denies leaking of fluid.   The following portions of the patient's history were reviewed and updated as appropriate: allergies, current medications, past family history, past medical history, past social history, past surgical history and problem list.   Objective:   Vitals:   03/13/21 1333  BP: 117/75  Pulse: (!) 108  Weight: 186 lb 12.8 oz (84.7 kg)    Fetal Status: Fetal Heart Rate (bpm): 141 Fundal Height: 39 cm Movement: Present     General:  Alert, oriented and cooperative. Patient is in no acute distress.  Skin: Skin is warm and dry. No rash noted.   Cardiovascular: Normal heart rate noted  Respiratory: Normal respiratory effort, no problems with respiration noted  Abdomen: Soft, gravid, appropriate for gestational age.  Pain/Pressure: Absent     Pelvic: Cervical exam deferred        Extremities: Normal range of motion.  Edema: None  Mental Status: Normal mood and affect. Normal behavior. Normal judgment and thought content.   Assessment and Plan:  Pregnancy: [id number] at [redacted]w[redacted]d 1. Supervision of low-risk pregnancy, second trimester - Doing well, feeling vigorous fetal movement  2. [redacted] weeks gestation of pregnancy - Typically goes post-dates far enough for IOL, amenable to scheduling for next week. - Reviewed IOL methods and expectations  Term labor symptoms and general obstetric precautions including but not limited to  vaginal bleeding, contractions, leaking of fluid and fetal movement were reviewed in detail with the patient. Please refer to After Visit Summary for other counseling recommendations.   Return in about 5 weeks (around 04/17/2021) for IN-PERSON, PP.  Future Appointments  Date Time Provider [place]  03/21/2021 12:00 AM [facility] Elida Rayette Mogg, CNM

## 2021-03-14 NOTE — Telephone Encounter (Signed)
Preadmission screen  

## 2021-03-15 ENCOUNTER — Encounter (HOSPITAL_COMMUNITY): Payer: Self-pay | Admitting: *Deleted

## 2021-03-15 ENCOUNTER — Telehealth (HOSPITAL_COMMUNITY): Payer: Self-pay | Admitting: *Deleted

## 2021-03-15 NOTE — Telephone Encounter (Signed)
Preadmission screen  

## 2021-03-16 ENCOUNTER — Inpatient Hospital Stay (HOSPITAL_COMMUNITY): Payer: Medicaid Other | Admitting: Anesthesiology

## 2021-03-16 ENCOUNTER — Encounter (HOSPITAL_COMMUNITY): Payer: Self-pay | Admitting: Obstetrics and Gynecology

## 2021-03-16 ENCOUNTER — Other Ambulatory Visit: Payer: Self-pay

## 2021-03-16 ENCOUNTER — Inpatient Hospital Stay (HOSPITAL_COMMUNITY)
Admission: AD | Admit: 2021-03-16 | Discharge: 2021-03-17 | DRG: 807 | Disposition: A | Discharge status: Home / Self Care | Payer: Medicaid Other | Attending: Obstetrics and Gynecology | Admitting: Obstetrics and Gynecology

## 2021-03-16 DIAGNOSIS — Z20822 Contact with and (suspected) exposure to covid-19: Secondary | ICD-10-CM | POA: Diagnosis present

## 2021-03-16 DIAGNOSIS — Z87891 Personal history of nicotine dependence: Secondary | ICD-10-CM | POA: Diagnosis not present

## 2021-03-16 DIAGNOSIS — O26893 Other specified pregnancy related conditions, third trimester: Secondary | ICD-10-CM | POA: Diagnosis present

## 2021-03-16 DIAGNOSIS — F32A Depression, unspecified: Secondary | ICD-10-CM | POA: Diagnosis present

## 2021-03-16 DIAGNOSIS — O99344 Other mental disorders complicating childbirth: Secondary | ICD-10-CM | POA: Diagnosis present

## 2021-03-16 DIAGNOSIS — F419 Anxiety disorder, unspecified: Secondary | ICD-10-CM | POA: Diagnosis present

## 2021-03-16 DIAGNOSIS — O480 Post-term pregnancy: Secondary | ICD-10-CM | POA: Diagnosis present

## 2021-03-16 DIAGNOSIS — Z3492 Encounter for supervision of normal pregnancy, unspecified, second trimester: Secondary | ICD-10-CM

## 2021-03-16 DIAGNOSIS — Z3A40 40 weeks gestation of pregnancy: Secondary | ICD-10-CM

## 2021-03-16 LAB — CBC
HCT: 33.3 % — ABNORMAL LOW (ref 36.0–46.0)
Hemoglobin: 10.9 g/dL — ABNORMAL LOW (ref 12.0–15.0)
MCH: 30.8 pg (ref 26.0–34.0)
MCHC: 32.7 g/dL (ref 30.0–36.0)
MCV: 94.1 fL (ref 80.0–100.0)
Platelets: 429 10*3/uL — ABNORMAL HIGH (ref 150–400)
RBC: 3.54 MIL/uL — ABNORMAL LOW (ref 3.87–5.11)
RDW: 13.4 % (ref 11.5–15.5)
WBC: 16.1 10*3/uL — ABNORMAL HIGH (ref 4.0–10.5)
nRBC: 0 % (ref 0.0–0.2)

## 2021-03-16 LAB — TYPE AND SCREEN
ABO/RH(D): A POS
Antibody Screen: NEGATIVE

## 2021-03-16 LAB — RESP PANEL BY RT-PCR (FLU A&B, COVID) ARPGX2
Influenza A by PCR: NEGATIVE
Influenza B by PCR: NEGATIVE
SARS Coronavirus 2 by RT PCR: NEGATIVE

## 2021-03-16 LAB — RPR: RPR Ser Ql: NONREACTIVE

## 2021-03-16 MED ORDER — DIPHENHYDRAMINE HCL 25 MG PO CAPS: 25.0000 mg | ORAL_CAPSULE | Freq: Four times a day (QID) | ORAL | Status: DC | PRN

## 2021-03-16 MED ORDER — LACTATED RINGERS IV SOLN: 500.0000 mL | Freq: Once | INTRAVENOUS | Status: DC

## 2021-03-16 MED ORDER — DIBUCAINE (PERIANAL) 1 % EX OINT: 1.0000 "application " | TOPICAL_OINTMENT | CUTANEOUS | Status: DC | PRN

## 2021-03-16 MED ORDER — OXYTOCIN-SODIUM CHLORIDE 30-0.9 UT/500ML-% IV SOLN: 2.5000 [IU]/h | INTRAVENOUS | Status: DC

## 2021-03-16 MED ORDER — OXYTOCIN BOLUS FROM INFUSION: 333.0000 mL | Freq: Once | INTRAVENOUS | Status: DC

## 2021-03-16 MED ORDER — OXYCODONE-ACETAMINOPHEN 5-325 MG PO TABS: 1.0000 | ORAL_TABLET | ORAL | Status: DC | PRN

## 2021-03-16 MED ORDER — FLEET ENEMA 7-19 GM/118ML RE ENEM: 1.0000 | ENEMA | RECTAL | Status: DC | PRN

## 2021-03-16 MED ORDER — SIMETHICONE 80 MG PO CHEW
80.0000 mg | CHEWABLE_TABLET | ORAL | Status: DC | PRN
  Filled 2021-03-16: qty 1

## 2021-03-16 MED ORDER — FENTANYL-BUPIVACAINE-NACL 0.5-0.125-0.9 MG/250ML-% EP SOLN
12.0000 mL/h | EPIDURAL | Status: DC | PRN
  Administered 2021-03-16: 12 mL/h via EPIDURAL
  Filled 2021-03-16: qty 250

## 2021-03-16 MED ORDER — PRENATAL MULTIVITAMIN CH
1.0000 | ORAL_TABLET | Freq: Every day | ORAL | Status: DC
  Administered 2021-03-17: 1 via ORAL
  Filled 2021-03-16: qty 1

## 2021-03-16 MED ORDER — EPHEDRINE 5 MG/ML INJ: 10.0000 mg | INTRAVENOUS | Status: DC | PRN

## 2021-03-16 MED ORDER — COCONUT OIL OIL: 1.0000 "application " | TOPICAL_OIL | Status: DC | PRN

## 2021-03-16 MED ORDER — MISOPROSTOL 25 MCG QUARTER TABLET: 25.0000 ug | ORAL_TABLET | ORAL | Status: DC | PRN

## 2021-03-16 MED ORDER — OXYCODONE-ACETAMINOPHEN 5-325 MG PO TABS: 2.0000 | ORAL_TABLET | ORAL | Status: DC | PRN

## 2021-03-16 MED ORDER — DIPHENHYDRAMINE HCL 50 MG/ML IJ SOLN: 12.5000 mg | INTRAMUSCULAR | Status: DC | PRN

## 2021-03-16 MED ORDER — LACTATED RINGERS IV SOLN: 500.0000 mL | INTRAVENOUS | Status: DC | PRN

## 2021-03-16 MED ORDER — ONDANSETRON HCL 4 MG/2ML IJ SOLN: 4.0000 mg | INTRAMUSCULAR | Status: DC | PRN

## 2021-03-16 MED ORDER — FENTANYL CITRATE (PF) 100 MCG/2ML IJ SOLN: 100.0000 ug | INTRAMUSCULAR | Status: DC | PRN

## 2021-03-16 MED ORDER — OXYTOCIN BOLUS FROM INFUSION
333.0000 mL | Freq: Once | INTRAVENOUS | Status: AC
  Administered 2021-03-16: 333 mL via INTRAVENOUS

## 2021-03-16 MED ORDER — IBUPROFEN 600 MG PO TABS
600.0000 mg | ORAL_TABLET | Freq: Four times a day (QID) | ORAL | Status: DC
  Administered 2021-03-16 – 2021-03-17 (×4): 600 mg via ORAL
  Filled 2021-03-16 (×4): qty 1

## 2021-03-16 MED ORDER — LIDOCAINE HCL (PF) 1 % IJ SOLN
INTRAMUSCULAR | Status: DC | PRN
  Administered 2021-03-16 (×2): 4 mL via EPIDURAL

## 2021-03-16 MED ORDER — ONDANSETRON HCL 4 MG/2ML IJ SOLN
4.0000 mg | Freq: Four times a day (QID) | INTRAMUSCULAR | Status: DC | PRN
  Administered 2021-03-16: 4 mg via INTRAVENOUS
  Filled 2021-03-16: qty 2

## 2021-03-16 MED ORDER — SOD CITRATE-CITRIC ACID 500-334 MG/5ML PO SOLN: 30.0000 mL | ORAL | Status: DC | PRN

## 2021-03-16 MED ORDER — ACETAMINOPHEN 325 MG PO TABS: 650.0000 mg | ORAL_TABLET | ORAL | Status: DC | PRN

## 2021-03-16 MED ORDER — WITCH HAZEL-GLYCERIN EX PADS: 1.0000 "application " | MEDICATED_PAD | CUTANEOUS | Status: DC | PRN

## 2021-03-16 MED ORDER — LACTATED RINGERS IV SOLN: INTRAVENOUS | Status: DC

## 2021-03-16 MED ORDER — FENTANYL CITRATE (PF) 100 MCG/2ML IJ SOLN
INTRAMUSCULAR | Status: AC
  Administered 2021-03-16: 100 ug via INTRAVENOUS
  Filled 2021-03-16: qty 2

## 2021-03-16 MED ORDER — TERBUTALINE SULFATE 1 MG/ML IJ SOLN: 0.2500 mg | Freq: Once | INTRAMUSCULAR | Status: DC | PRN

## 2021-03-16 MED ORDER — PHENYLEPHRINE 40 MCG/ML (10ML) SYRINGE FOR IV PUSH (FOR BLOOD PRESSURE SUPPORT): 80.0000 ug | PREFILLED_SYRINGE | INTRAVENOUS | Status: DC | PRN

## 2021-03-16 MED ORDER — OXYTOCIN-SODIUM CHLORIDE 30-0.9 UT/500ML-% IV SOLN
1.0000 m[IU]/min | INTRAVENOUS | Status: DC
  Administered 2021-03-16: 2 m[IU]/min via INTRAVENOUS
  Filled 2021-03-16: qty 500

## 2021-03-16 MED ORDER — TETANUS-DIPHTH-ACELL PERTUSSIS 5-2.5-18.5 LF-MCG/0.5 IM SUSY: 0.5000 mL | PREFILLED_SYRINGE | Freq: Once | INTRAMUSCULAR | Status: DC

## 2021-03-16 MED ORDER — ONDANSETRON HCL 4 MG PO TABS: 4.0000 mg | ORAL_TABLET | ORAL | Status: DC | PRN

## 2021-03-16 MED ORDER — ACETAMINOPHEN 325 MG PO TABS
650.0000 mg | ORAL_TABLET | ORAL | Status: DC | PRN
  Administered 2021-03-16: 650 mg via ORAL
  Filled 2021-03-16: qty 2

## 2021-03-16 MED ORDER — BENZOCAINE-MENTHOL 20-0.5 % EX AERO: 1.0000 "application " | INHALATION_SPRAY | CUTANEOUS | Status: DC | PRN

## 2021-03-16 MED ORDER — LIDOCAINE HCL (PF) 1 % IJ SOLN: 30.0000 mL | INTRAMUSCULAR | Status: DC | PRN

## 2021-03-16 MED ORDER — ONDANSETRON HCL 4 MG/2ML IJ SOLN: 4.0000 mg | Freq: Four times a day (QID) | INTRAMUSCULAR | Status: DC | PRN

## 2021-03-16 MED ORDER — ZOLPIDEM TARTRATE 5 MG PO TABS: 5.0000 mg | ORAL_TABLET | Freq: Every evening | ORAL | Status: DC | PRN

## 2021-03-16 MED ORDER — SENNOSIDES-DOCUSATE SODIUM 8.6-50 MG PO TABS
2.0000 | ORAL_TABLET | Freq: Every day | ORAL | Status: DC
  Administered 2021-03-17: 2 via ORAL
  Filled 2021-03-16: qty 2

## 2021-03-16 NOTE — H&P (Signed)
OBSTETRIC ADMISSION HISTORY AND PHYSICAL  Terrill Wauters is a 30 y.o. female [phone] with IUP at 51w2dby 8wk UKore[place]resenting for labor. She reports +FMs, No LOF, no VB, no blurry vision, headaches or peripheral edema, and RUQ pain.  She plans on breast feeding. She is undecided for birth control. [facility]  Dating: By 8wk [place]orea--->  Estimated Date of Delivery: 03/14/21  Sono:    @[redacted]w[redacted]d , CWD, normal anatomy, cephalic presentation, [id number] 39% EFW   Prenatal History/Complications:  Hx of anxiety/depression, on Wellbutrin  Rubella non immune  Past Medical History: Past Medical History:  Diagnosis Date  . Anxiety   . Chlamydia 2008  . Depression   . Late prenatal care affecting pregnancy in second trimester   . Limited prenatal care in third trimester 06/16/2016  . Normal pregnancy 08/17/2012  . SVD (spontaneous vaginal delivery) 08/17/2012  . SVD (spontaneous vaginal delivery) 06/18/2016  . SVD (spontaneous vaginal delivery) 06/18/2016    Past Surgical History: Past Surgical History:  Procedure Laterality Date  . DILATION AND CURETTAGE OF UTERUS    . NO PAST SURGERIES      Obstetrical History: OB History    Gravida  7   Para  3   Term  3   Preterm  0   AB  3   Living  3     SAB  1   IAB  2   Ectopic  0   Multiple  0   Live Births  3           Social History Social History   Socioeconomic History  . Marital status: Significant Other    Spouse name: Not on file  . Number of children: Not on file  . Years of education: Not on file  . Highest education level: Not on file  Occupational History  . Not on file  Tobacco Use  . Smoking status: Former Smoker    Packs/day: 0.50    Quit date: 06/27/2020    Years since quitting: 0.7  . Smokeless tobacco: Never Used  Vaping Use  . Vaping Use: Never used  Substance and Sexual Activity  . Alcohol use: No  . Drug use: No  . Sexual activity: Not Currently    Birth  control/protection: None  Other Topics Concern  . Not on file  Social History Narrative  . Not on file   Social Determinants of Health   Financial Resource Strain: Not on file  Food Insecurity: Food Insecurity Present  . Worried About RCharity fundraiserin the Last Year: Sometimes true  . Ran Out of Food in the Last Year: Never true  Transportation Needs: No Transportation Needs  . Lack of Transportation (Medical): No  . Lack of Transportation (Non-Medical): No  Physical Activity: Not on file  Stress: Not on file  Social Connections: Not on file    Family History: Family History  Problem Relation Age of Onset  . Diabetes Maternal Grandmother   . Diabetes Paternal Grandmother     Allergies: No Known Allergies  Medications Prior to Admission  Medication Sig Dispense Refill Last Dose  . buPROPion (WELLBUTRIN XL) 150 MG 24 hr tablet Take 1 tablet (150 mg total) by mouth daily. 30 tablet 3 03/15/2021 at Unknown time  . Prenatal Vit-Fe Fumarate-FA (PRENATAL PO) Take by mouth.   03/15/2021 at Unknown time  . acetaminophen (TYLENOL) 500 MG tablet Take 2 tablets (1,000 mg total) by mouth  every 8 (eight) hours. 30 tablet 0   . cyclobenzaprine (FLEXERIL) 10 MG tablet Take 1 tablet (10 mg total) by mouth every 8 (eight) hours as needed for muscle spasms. 30 tablet 1 More than a month at Unknown time  . docusate sodium (COLACE) 100 MG capsule Take 1 capsule (100 mg total) by mouth 2 (two) times daily. 60 capsule 0   . ondansetron (ZOFRAN ODT) 4 MG disintegrating tablet Take 1 tablet (4 mg total) by mouth every 8 (eight) hours as needed for nausea or vomiting. 30 tablet 2 More than a month at Unknown time  . polyethylene glycol (MIRALAX) 17 g packet Take 17 g by mouth daily. 14 each 0      Review of Systems   All systems reviewed and negative except as stated in HPI  Blood pressure (!) 119/59, pulse 87, temperature 99.1 F (37.3 C), temperature source Oral, resp. rate 16, height 5' 4"   (1.626 m), weight 85.7 kg, last menstrual period 06/18/2020, SpO2 98 %, unknown if currently breastfeeding. General appearance: alert, cooperative and appears stated age Lungs: normal effort Abdomen: soft, non-tender; bowel sounds normal Extremities: Homans sign is negative, no sign of DVT  Presentation: cephalic Fetal monitoring baseline 130, mod variability, pos accels, no decels  Uterine activity q2-3 min Dilation: 5.5 Effacement (%): 80 Station: -2 Exam by:: Simona Huh, RNC-OB   Prenatal labs: ABO, Rh: --/--/A POS (05/21 0222) Antibody: NEG (05/21 0222) Rubella: <0.90 (01/19 1202) RPR: Non Reactive (01/19 1202)  HBsAg: Negative (01/19 1202)  HIV: Non Reactive (01/19 1202)  GBS: Negative/-- (04/27 1601)  2 hr Glucola normal Genetic screening  normal Anatomy US normal  Prenatal Transfer Tool  Maternal Diabetes: No Genetic Screening: Normal Maternal Ultrasounds/Referrals: Normal Fetal Ultrasounds or other Referrals:  None Maternal Substance Abuse:  No Significant Maternal Medications:  Meds include: Other: welbutrin Significant Maternal Lab Results: Group B Strep negative  Results for orders placed or performed during the hospital encounter of 03/16/21 (from the past 24 hour(s))  Resp Panel by RT-PCR (Flu A&B, Covid) Nasopharyngeal Swab   Collection Time: 03/16/21  2:07 AM   Specimen: Nasopharyngeal Swab; Nasopharyngeal(NP) swabs in vial transport medium  Result Value Ref Range   SARS Coronavirus 2 by RT PCR NEGATIVE NEGATIVE   Influenza A by PCR NEGATIVE NEGATIVE   Influenza B by PCR NEGATIVE NEGATIVE  CBC   Collection Time: 03/16/21  2:18 AM  Result Value Ref Range   WBC 16.1 (H) 4.0 - 10.5 K/uL   RBC 3.54 (L) 3.87 - 5.11 MIL/uL   Hemoglobin 10.9 (L) 12.0 - 15.0 g/dL   HCT 33.3 (L) 36.0 - 46.0 %   MCV 94.1 80.0 - 100.0 fL   MCH 30.8 26.0 - 34.0 pg   MCHC 32.7 30.0 - 36.0 g/dL   RDW 13.4 11.5 - 15.5 %   Platelets 429 (H) 150 - 400 K/uL   nRBC 0.0 0.0 - 0.2 %   Type and screen   Collection Time: 03/16/21  2:22 AM  Result Value Ref Range   ABO/RH(D) A POS    Antibody Screen NEG    Sample Expiration      03/19/2021,2359 Performed at [facility], [address], [place], [place] [address]     Patient Active Problem List   Diagnosis Date Noted  . Indication for care in labor or delivery 03/16/2021  . Post term pregnancy over 40 weeks 03/16/2021  . Adult ADHD 02/06/2021  . Need for prophylactic  vaccination with measles-mumps-rubella (MMR) vaccine 11/17/2020  . Gastroesophageal reflux disease 11/14/2020  . Supervision of low-risk pregnancy, second trimester 11/14/2020    Assessment/Plan:  Alaine Loughney is a 30 y.o. [phone] at 33w2dhere for spontaneous labor  #Labor: contracting regularly, will continue expectant mgmt for now  #Pain: Epidural placed on arrival to L&D  #anxiety/depression: continue PTA welbutrin. SW consult pp.  #FWB: Cat I  #ID:  gbs neg  #MOF: breast #MOC:unsure - has signed BTL papers but undecided. Rediscuss.  #Circ:  Desires outpatient circ   JJanet Berlin MD  03/16/2021, 5:36 AM

## 2021-03-16 NOTE — Plan of Care (Signed)
Pt encouraged to rest.  No change in POC

## 2021-03-16 NOTE — Discharge Summary (Signed)
Postpartum Discharge Summary    Patient Name: Jordan Fowler DOB: 10/06/1991 MRN: [id number]  Date of admission: 03/16/2021 Delivery date:03/16/2021  Delivering provider: Wende Mott  Date of discharge: 03/17/2021  Admitting diagnosis: Indication for care in labor or delivery [O75.9] Post term pregnancy over 40 weeks [O48.0] Intrauterine pregnancy: [redacted]w[redacted]d    Secondary diagnosis:  Active Problems:   Indication for care in labor or delivery   Post term pregnancy over 40 weeks  Additional problems: n/a    Discharge diagnosis: Term Pregnancy Delivered                                              Post partum procedures:n/a Augmentation: AROM and Pitocin Complications: None  Hospital course: Onset of Labor With Vaginal Delivery      30y.o. yo [id number] 421w2das admitted in Active Labor on 03/16/2021. Patient had an uncomplicated labor course as follows: She was AROM at 8cm and progressed with pitocin to complete. Pushed one time to delivery without complication Membrane Rupture Time/Date: 9:00 AM ,03/16/2021   Delivery Method:Vaginal, Spontaneous  Episiotomy: None  Lacerations:  None  Patient had an uncomplicated postpartum course.  She is ambulating, tolerating a regular diet, passing flatus, and urinating well. Patient is discharged home in stable condition on 03/17/21.  Newborn Data: Birth date:03/16/2021  Birth time:2:38 PM  Gender:Female  Living status:Living  Apgars:8 ,9  Weight:3385 g   Magnesium Sulfate received: No BMZ received: No Rhophylac:N/A MMR:N/A T-DaP:Given prenatally Flu: Yes Transfusion:No  Physical exam  Vitals:   03/16/21 1810 03/16/21 2210 03/17/21 0230 03/17/21 0523  BP: 103/65 104/68 113/82 110/72  Pulse: 83 83 73 75  Resp: _0 Temp: 97.7 F (36.5 C) 97.8 F (36.6 C) 97.7 F (36.5 C) 98 F (36.7 C)  TempSrc: Oral  Oral   SpO2: 100% 100% 100% 99%  Weight:      Height:       General: alert, cooperative and no  distress Lochia: appropriate Uterine Fundus: firm Incision: N/A DVT Evaluation: No evidence of DVT seen on physical exam. Labs: Lab Results  Component Value Date   WBC 17.3 (H) 03/17/2021   HGB 9.8 (L) 03/17/2021   HCT 30.4 (L) 03/17/2021   MCV 94.1 03/17/2021   PLT 359 03/17/2021   CMP Latest Ref Rng & Units 01/18/2021  Glucose 70 - 99 mg/dL 80  BUN 6 - 20 mg/dL 7  Creatinine 0.44 - 1.00 mg/dL 0.60  Sodium 135 - 145 mmol/L 133(L)  Potassium 3.5 - 5.1 mmol/L 3.7  Chloride 98 - 111 mmol/L 104  CO2 22 - 32 mmol/L 18(L)  Calcium 8.9 - 10.3 mg/dL 8.8(L)  Total Protein 6.5 - 8.1 g/dL -  Total Bilirubin 0.3 - 1.2 mg/dL -  Alkaline Phos 38 - 126 U/L -  AST 15 - 41 U/L -  ALT 0 - 44 U/L -   Edinburgh Score: Edinburgh Postnatal Depression Scale Screening Tool 03/16/2021  I have been able to laugh and see the funny side of things. 0  I have looked forward with enjoyment to things. 0  I have blamed myself unnecessarily when things went wrong. 2  I have been anxious or worried for no good reason. 2  I have felt scared or panicky for no good reason. 1  Things have been getting on  top of me. 2  I have been so unhappy that I have had difficulty sleeping. 2  I have felt sad or miserable. 1  I have been so unhappy that I have been crying. 1  The thought of harming myself has occurred to me. 0  Edinburgh Postnatal Depression Scale Total 11     After visit meds:  Allergies as of 03/17/2021   No Known Allergies     Medication List    TAKE these medications   acetaminophen 500 MG tablet Commonly known as: TYLENOL Take 2 tablets (1,000 mg total) by mouth every 8 (eight) hours.   buPROPion 150 MG 24 hr tablet Commonly known as: Wellbutrin XL Take 1 tablet (150 mg total) by mouth daily.   cyclobenzaprine 10 MG tablet Commonly known as: FLEXERIL Take 1 tablet (10 mg total) by mouth every 8 (eight) hours as needed for muscle spasms.   docusate sodium 100 MG capsule Commonly known  as: COLACE Take 1 capsule (100 mg total) by mouth 2 (two) times daily.   ibuprofen 600 MG tablet Commonly known as: ADVIL Take 1 tablet (600 mg total) by mouth every 6 (six) hours.   ondansetron 4 MG disintegrating tablet Commonly known as: Zofran ODT Take 1 tablet (4 mg total) by mouth every 8 (eight) hours as needed for nausea or vomiting.   polyethylene glycol 17 g packet Commonly known as: MiraLax Take 17 g by mouth daily.   PRENATAL PO Take by mouth.        Discharge home in stable condition Infant Feeding: Breast Infant Disposition:home with mother Discharge instruction: per After Visit Summary and Postpartum booklet. Activity: Advance as tolerated. Pelvic rest for 6 weeks.  Diet: routine diet Future Appointments: Future Appointments  Date Time Provider [place]  03/19/2021  9:30 AM [facility]-SCREENING [facility] None   Follow up Visit:  [facility] Follow up.   Specialty: Obstetrics and Gynecology Why: For a postpartum appointment Contact information: [place] [address] [phone]               Please schedule this patient for a In person postpartum visit in 4 weeks with the following provider: Any provider. Additional Postpartum F/U:n/a  Low risk pregnancy complicated by: n/a Delivery mode:  Vaginal, Spontaneous  Anticipated Birth Control:  Unsure   03/17/2021 Wende Mott, CNM

## 2021-03-16 NOTE — MAU Note (Signed)
Ctxs all day today but more regular and stronger since 2245. Denies LOF or VB. Has not had sve.

## 2021-03-16 NOTE — Progress Notes (Signed)
On recheck, cervix unchanged. Will start pitocin 2x2. Patient agreeable to plan of care.   Rolm Bookbinder, CNM 03/16/21 11:26 AM

## 2021-03-16 NOTE — Anesthesia Procedure Notes (Signed)
Epidural Patient location during procedure: OB Start time: 03/16/2021 4:11 AM End time: 03/16/2021 4:14 AM  Staffing Anesthesiologist: Kaylyn Layer, MD Performed: anesthesiologist   Preanesthetic Checklist Completed: patient identified, IV checked, risks and benefits discussed, monitors and equipment checked, pre-op evaluation and timeout performed  Epidural Patient position: sitting Prep: DuraPrep and site prepped and draped Patient monitoring: continuous pulse ox, blood pressure and heart rate Approach: midline Location: L3-L4 Injection technique: LOR air  Needle:  Needle type: Tuohy  Needle gauge: 17 G Needle length: 9 cm Needle insertion depth: 5 cm Catheter type: closed end flexible Catheter size: 19 Gauge Catheter at skin depth: 10 cm Test dose: negative and Other (1% lidocaine)  Assessment Events: blood not aspirated, injection not painful, no injection resistance, no paresthesia and negative IV test  Additional Notes Patient identified. Risks, benefits, and alternatives discussed with patient including but not limited to bleeding, infection, nerve damage, paralysis, failed block, incomplete pain control, headache, blood pressure changes, nausea, vomiting, reactions to medication, itching, and postpartum back pain. Confirmed with bedside nurse the patient's most recent platelet count. Confirmed with patient that they are not currently taking any anticoagulation, have any bleeding history, or any family history of bleeding disorders. Patient expressed understanding and wished to proceed. All questions were answered. Sterile technique was used throughout the entire procedure. Please see nursing notes for vital signs.   Crisp LOR on first pass. Test dose was given through epidural catheter and negative prior to continuing to dose epidural or start infusion. Warning signs of high block given to the patient including shortness of breath, tingling/numbness in hands, complete  motor block, or any concerning symptoms with instructions to call for help. Patient was given instructions on fall risk and not to get out of bed. All questions and concerns addressed with instructions to call with any issues or inadequate analgesia.  Reason for block:procedure for pain

## 2021-03-16 NOTE — Anesthesia Preprocedure Evaluation (Signed)
Anesthesia Evaluation  Patient identified by MRN, date of birth, ID band Patient awake    Reviewed: Allergy & Precautions, Patient's Chart, lab work & pertinent test results  History of Anesthesia Complications Negative for: history of anesthetic complications  Airway Mallampati: II  TM Distance: >3 FB Neck ROM: Full    Dental no notable dental hx.    Pulmonary former smoker,    Pulmonary exam normal        Cardiovascular negative cardio ROS Normal cardiovascular exam     Neuro/Psych Anxiety Depression negative neurological ROS     GI/Hepatic negative GI ROS, Neg liver ROS,   Endo/Other  negative endocrine ROS  Renal/GU negative Renal ROS  negative genitourinary   Musculoskeletal negative musculoskeletal ROS (+)   Abdominal   Peds  Hematology negative hematology ROS (+)   Anesthesia Other Findings Day of surgery medications reviewed with patient.  Reproductive/Obstetrics (+) Pregnancy                             Anesthesia Physical Anesthesia Plan  ASA: II  Anesthesia Plan: Epidural   Post-op Pain Management:    Induction:   PONV Risk Score and Plan: Treatment may vary due to age or medical condition  Airway Management Planned: Natural Airway  Additional Equipment:   Intra-op Plan:   Post-operative Plan:   Informed Consent: I have reviewed the patients History and Physical, chart, labs and discussed the procedure including the risks, benefits and alternatives for the proposed anesthesia with the patient or authorized representative who has indicated his/her understanding and acceptance.       Plan Discussed with:   Anesthesia Plan Comments:         Anesthesia Quick Evaluation

## 2021-03-16 NOTE — Progress Notes (Signed)
MOB was referred for history of depression/anxiety. * Referral screened out by Clinical Social Worker because none of the following criteria appear to apply:  ~ History of anxiety/depression during this pregnancy, or of post-partum depression following prior delivery. ~ Diagnosis of anxiety and/or depression within last 3 years OR * MOB's symptoms currently being treated with medication and/or therapy. Per chart MOB symptoms are currently being treated with Wellbutrin.   Please contact the Clinical Social Worker if needs arise, by [facility] request, or if MOB scores greater than 9/yes to question 10 on Edinburgh Postpartum Depression Screen.   Dolores Frame, MSW, LCSW-A Clinical Social Worker [phone]

## 2021-03-16 NOTE — Progress Notes (Signed)
Labor Progress Note Jordan Fowler is a 30 y.o. [phone] at [redacted]w[redacted]d presented for spontaneous onset of labor  S:  Patient comfortable with epidural. Requesting AROM   O:  BP 118/71   Pulse 84   Temp 98.3 F (36.8 C) (Oral)   Resp 16   Ht 5\' 4"  (1.626 m)   Wt 85.7 kg   LMP 06/18/2020 (Approximate)   SpO2 98%   BMI 32.44 kg/m   Fetal Tracing:  Baseline: 125 Variability: moderate Accels: 15x15 Decels: none  Toco: 2-4   CVE: Dilation: 7 Effacement (%): 80 Cervical Position: Middle Station: 0 Presentation: Vertex Exam by:: CNM Alexzavier Girardin   A&P: 30 y.o. 34 [redacted]w[redacted]d spontaneous onset of labor #Labor: Progressing well. Discussed with patient risks and benefits of AROM for augmentation of labor. Patient agreeable to plan of care. AROM with large amount of meconium stained fluid. Patient and FHR tolerated procedure well.   Patient reports in previous deliveries, she never felt pressure and had to be aroused because baby was delivering. Will plan to reassess in 1 hour.  #Pain: epidural #FWB: Cat I #GBS negative  [redacted]w[redacted]d, CNM 9:18 AM

## 2021-03-17 LAB — CBC
HCT: 30.4 % — ABNORMAL LOW (ref 36.0–46.0)
Hemoglobin: 9.8 g/dL — ABNORMAL LOW (ref 12.0–15.0)
MCH: 30.3 pg (ref 26.0–34.0)
MCHC: 32.2 g/dL (ref 30.0–36.0)
MCV: 94.1 fL (ref 80.0–100.0)
Platelets: 359 10*3/uL (ref 150–400)
RBC: 3.23 MIL/uL — ABNORMAL LOW (ref 3.87–5.11)
RDW: 13.6 % (ref 11.5–15.5)
WBC: 17.3 10*3/uL — ABNORMAL HIGH (ref 4.0–10.5)
nRBC: 0 % (ref 0.0–0.2)

## 2021-03-17 MED ORDER — FERROUS SULFATE 325 (65 FE) MG PO TABS: 325.0000 mg | ORAL_TABLET | Freq: Every day | ORAL | Status: DC

## 2021-03-17 MED ORDER — IBUPROFEN 600 MG PO TABS: 600.0000 mg | ORAL_TABLET | Freq: Four times a day (QID) | ORAL | 0 refills | Status: AC

## 2021-03-17 NOTE — Progress Notes (Signed)
CSW met with MOB to complete consult for [place] of 11, hx of anxiety and depression. CSW observed MOB resting in bed, infant in bassinet, and FOB laying on the couch. MOB gave CSW verbal consent to complete consult while FOB was present. CSW explained role and reason for consult. MOB was pleasant, polite and engaged with CSW. MOB reported, she was very honest when completing the [place]. MOB reported, dx of anxiety and depression from 3-4 years ago. MOB reported, she is currently on Wellbutrin and it is managing her symptoms.   CSW provided education regarding the baby blues period vs. perinatal mood disorders, discussed treatment and gave resources for mental health follow up if concerns arise. CSW recommends self- evaluation during the postpartum time period using the New Mom Checklist from Postpartum Progress and encouraged MOB to contact a medical professional if symptoms are noted at any time.   When CSW asked MOB about her emotions since delivery. MOB reported, she feels "good". MOB denied SI, and HI when CSW assessed for safety. MOB reported, FOB and her mother are her supports.   MOB reported, there are no barriers to follow up infant's care. MOB reported, she has all essentials needed to care for infant. MOB reported, infant has a car seat and bassinet. MOB denied any additional barriers.     CSW provided education on Sudden Infant Death Syndrome (SIDS).    CSW identifies no further need for intervention or barriers to discharge at this time.   Darcus Austin, MSW, LCSW-A Clinical Social Worker [phone]

## 2021-03-17 NOTE — Discharge Instructions (Signed)

## 2021-03-17 NOTE — Progress Notes (Signed)
Patient request iv be removed .Rn informed patient that her hemoglobin was 10.9 and that she would get a CBC in the am.informed patient that once the cbc came back iv would be taking out as long as it was wln. Patients iv site was clean dry intact. Hemoglobin was 9.8 this am Called Lynnda Shields she stated that the iv could come out and that she would give the patient po iron.

## 2021-03-17 NOTE — Anesthesia Postprocedure Evaluation (Signed)
Anesthesia Post Note  Patient: Jordan Fowler  Procedure(s) Performed: AN AD HOC LABOR EPIDURAL     Patient location during evaluation: Mother Baby Anesthesia Type: Epidural Level of consciousness: awake and alert Pain management: pain level controlled Vital Signs Assessment: post-procedure vital signs reviewed and stable Respiratory status: spontaneous breathing, nonlabored ventilation and respiratory function stable Cardiovascular status: stable Postop Assessment: no headache, no backache and epidural receding Anesthetic complications: no   No complications documented.  Last Vitals:  Vitals:   03/17/21 0230 03/17/21 0523  BP: 113/82 110/72  Pulse: 73 75  Resp: 20 20  Temp: 36.5 C 36.7 C  SpO2: 100% 99%    Last Pain:  Vitals:   03/17/21 0845  TempSrc:   PainSc: 0-No pain   Pain Goal: Patients Stated Pain Goal: 5 (03/16/21 0500)              Epidural/Spinal Function Cutaneous sensation: Normal sensation (03/17/21 0845), Patient able to flex knees: Yes (03/17/21 0845), Patient able to lift hips off bed: Yes (03/17/21 0845), Back pain beyond tenderness at insertion site: No (03/17/21 0845), Progressively worsening motor and/or sensory loss: No (03/17/21 0845), Bowel and/or bladder incontinence post epidural: No (03/17/21 0845)  [facility]ica Records

## 2021-03-19 ENCOUNTER — Other Ambulatory Visit (HOSPITAL_COMMUNITY): Payer: Medicaid Other

## 2021-03-21 ENCOUNTER — Inpatient Hospital Stay (HOSPITAL_COMMUNITY): Admission: AD | Admit: 2021-03-21 | Payer: Medicaid Other | Source: Home / Self Care | Admitting: Family Medicine

## 2021-03-21 ENCOUNTER — Inpatient Hospital Stay (HOSPITAL_COMMUNITY): Payer: Medicaid Other

## 2021-04-10 ENCOUNTER — Ambulatory Visit: Payer: Medicaid Other | Admitting: Certified Nurse Midwife

## 2021-05-23 ENCOUNTER — Other Ambulatory Visit: Payer: Self-pay | Admitting: *Deleted

## 2021-05-23 DIAGNOSIS — F419 Anxiety disorder, unspecified: Secondary | ICD-10-CM

## 2021-05-23 DIAGNOSIS — F32A Depression, unspecified: Secondary | ICD-10-CM

## 2021-05-23 MED ORDER — BUPROPION HCL ER (XL) 150 MG PO TB24: 150.0000 mg | ORAL_TABLET | Freq: Every day | ORAL | 2 refills | Status: AC

## 2021-06-11 ENCOUNTER — Telehealth: Payer: Self-pay | Admitting: Clinical

## 2021-06-11 NOTE — Telephone Encounter (Signed)
Attempt to contact pt to schedule visit with [facility]; Left HIPPA-compliant message to call back Asher Muir from [facility] [employer] at [facility] at  [phone] [facility] office), and left myChart message for pt.

## 2021-08-03 ENCOUNTER — Encounter: Payer: Self-pay | Admitting: Radiology

## 2022-01-29 ENCOUNTER — Telehealth (HOSPITAL_COMMUNITY): Payer: Self-pay | Admitting: Physician Assistant

## 2022-01-29 ENCOUNTER — Emergency Department (HOSPITAL_COMMUNITY): Payer: Medicaid Other

## 2022-01-29 ENCOUNTER — Emergency Department (HOSPITAL_COMMUNITY)
Admission: EM | Admit: 2022-01-29 | Discharge: 2022-01-29 | Disposition: A | Discharge status: Home / Self Care | Payer: Medicaid Other | Attending: Emergency Medicine | Admitting: Emergency Medicine

## 2022-01-29 ENCOUNTER — Other Ambulatory Visit: Payer: Self-pay

## 2022-01-29 DIAGNOSIS — F17210 Nicotine dependence, cigarettes, uncomplicated: Secondary | ICD-10-CM | POA: Insufficient documentation

## 2022-01-29 DIAGNOSIS — N200 Calculus of kidney: Secondary | ICD-10-CM

## 2022-01-29 DIAGNOSIS — R1032 Left lower quadrant pain: Secondary | ICD-10-CM | POA: Diagnosis present

## 2022-01-29 DIAGNOSIS — N132 Hydronephrosis with renal and ureteral calculous obstruction: Secondary | ICD-10-CM | POA: Diagnosis not present

## 2022-01-29 LAB — CBC
HCT: 39.4 % (ref 36.0–46.0)
Hemoglobin: 13.1 g/dL (ref 12.0–15.0)
MCH: 31.5 pg (ref 26.0–34.0)
MCHC: 33.2 g/dL (ref 30.0–36.0)
MCV: 94.7 fL (ref 80.0–100.0)
Platelets: 224 10*3/uL (ref 150–400)
RBC: 4.16 MIL/uL (ref 3.87–5.11)
RDW: 13.9 % (ref 11.5–15.5)
WBC: 3.1 10*3/uL — ABNORMAL LOW (ref 4.0–10.5)
nRBC: 0 % (ref 0.0–0.2)

## 2022-01-29 LAB — COMPREHENSIVE METABOLIC PANEL
ALT: 24 U/L (ref 0–44)
AST: 33 U/L (ref 15–41)
Albumin: 4.2 g/dL (ref 3.5–5.0)
Alkaline Phosphatase: 37 U/L — ABNORMAL LOW (ref 38–126)
Anion gap: 12 (ref 5–15)
BUN: 13 mg/dL (ref 6–20)
CO2: 21 mmol/L — ABNORMAL LOW (ref 22–32)
Calcium: 9 mg/dL (ref 8.9–10.3)
Chloride: 107 mmol/L (ref 98–111)
Creatinine, Ser: 0.93 mg/dL (ref 0.44–1.00)
GFR, Estimated: >60 mL/min (ref >60)
Glucose, Bld: 146 mg/dL — ABNORMAL HIGH (ref 70–99)
Potassium: 3.1 mmol/L — ABNORMAL LOW (ref 3.5–5.1)
Sodium: 140 mmol/L (ref 135–145)
Total Bilirubin: 0.3 mg/dL (ref 0.3–1.2)
Total Protein: 6.9 g/dL (ref 6.5–8.1)

## 2022-01-29 LAB — URINALYSIS, ROUTINE W REFLEX MICROSCOPIC
Glucose, UA: NEGATIVE mg/dL
Ketones, ur: NEGATIVE mg/dL
Leukocytes,Ua: NEGATIVE
Nitrite: NEGATIVE
Protein, ur: 30 mg/dL — AB
Specific Gravity, Urine: >1.03 — ABNORMAL HIGH (ref 1.005–1.030)
pH: 5.5 (ref 5.0–8.0)

## 2022-01-29 LAB — URINALYSIS, MICROSCOPIC (REFLEX)

## 2022-01-29 LAB — I-STAT BETA HCG BLOOD, ED (MC, WL, AP ONLY): I-stat hCG, quantitative: <5 m[IU]/mL (ref <5)

## 2022-01-29 LAB — LIPASE, BLOOD: Lipase: 23 U/L (ref 11–51)

## 2022-01-29 MED ORDER — HYDROCODONE-ACETAMINOPHEN 5-325 MG PO TABS: 1.0000 | ORAL_TABLET | Freq: Four times a day (QID) | ORAL | 0 refills | Status: DC | PRN

## 2022-01-29 MED ORDER — MORPHINE SULFATE (PF) 4 MG/ML IV SOLN
4.0000 mg | Freq: Once | INTRAVENOUS | Status: AC
  Administered 2022-01-29: 4 mg via INTRAVENOUS
  Filled 2022-01-29: qty 1

## 2022-01-29 MED ORDER — FENTANYL CITRATE PF 50 MCG/ML IJ SOSY
50.0000 ug | PREFILLED_SYRINGE | Freq: Once | INTRAMUSCULAR | Status: AC
  Administered 2022-01-29: 50 ug via INTRAMUSCULAR
  Filled 2022-01-29: qty 1

## 2022-01-29 MED ORDER — HYDROCODONE-ACETAMINOPHEN 5-325 MG PO TABS: 2.0000 | ORAL_TABLET | ORAL | 0 refills | Status: AC | PRN

## 2022-01-29 MED ORDER — ONDANSETRON HCL 4 MG/2ML IJ SOLN
4.0000 mg | Freq: Once | INTRAMUSCULAR | Status: AC
  Administered 2022-01-29: 4 mg via INTRAVENOUS
  Filled 2022-01-29: qty 2

## 2022-01-29 MED ORDER — IOHEXOL 300 MG/ML  SOLN
100.0000 mL | Freq: Once | INTRAMUSCULAR | Status: AC | PRN
  Administered 2022-01-29: 100 mL via INTRAVENOUS

## 2022-01-29 MED ORDER — HYDROCODONE-ACETAMINOPHEN 5-325 MG PO TABS: 2.0000 | ORAL_TABLET | ORAL | 0 refills | Status: DC | PRN

## 2022-01-29 MED ORDER — SODIUM CHLORIDE 0.9 % IV BOLUS
1000.0000 mL | Freq: Once | INTRAVENOUS | Status: AC
  Administered 2022-01-29: 1000 mL via INTRAVENOUS

## 2022-01-29 MED ORDER — KETOROLAC TROMETHAMINE 15 MG/ML IJ SOLN
15.0000 mg | Freq: Once | INTRAMUSCULAR | Status: AC
  Administered 2022-01-29: 15 mg via INTRAVENOUS
  Filled 2022-01-29: qty 1

## 2022-01-29 MED ORDER — ONDANSETRON HCL 4 MG PO TABS: 4.0000 mg | ORAL_TABLET | Freq: Four times a day (QID) | ORAL | 0 refills | Status: AC | PRN

## 2022-01-29 NOTE — ED Provider Notes (Signed)
?[facility] EMERGENCY DEPARTMENT ?Provider Note ? ? ?CSN: [id number] ?Arrival date & time: 01/29/22  1657 ? ?  ? ?History ? ?Chief Complaint  ?Patient presents with  ? Abdominal Pain  ? ? ?Jordan Fowler is a 31 y.o. female. ? ?31 year old female presents to ED with acute abdominal pain. Pain began this morning upon awakening and has not stopped since. Pain is described as sharp and is localized in the LLQ without radiation. Nothing makes it better or worse. Associated symptoms include difficulty voiding. Denies SOB, chest pain, n/v/d, vaginal bleeding, abnormal vaginal discharge, hematochezia, syncope, dizziness, lightheadedness, high risk sexual partners, known STD/STI ? ?PMHx is significant for four vaginal deliveries with no known complications. Family history of mom with "gallbladder disease", maternal grandmother with diabetes.  ? ?No surgical hx ? ?Smokes 1 ppd of cigarettes. Denies alcohol or illicit drug use.  ? ?Abdominal Pain ?Associated symptoms: no chest pain, no chills, no constipation, no cough, no diarrhea, no dysuria, no fatigue, no fever, no nausea, no shortness of breath, no vaginal bleeding and no vomiting   ? ?  ? ?Home Medications ?Prior to Admission medications   ?Medication Sig Start Date End Date Taking? Authorizing Provider  ?acetaminophen (TYLENOL) 500 MG tablet Take 2 tablets (1,000 mg total) by mouth every 8 (eight) hours. 10/30/20  Yes Sheila Oats, MD  ?buPROPion (WELLBUTRIN XL) 150 MG 24 hr tablet Take 1 tablet (150 mg total) by mouth daily. 05/23/21   Bernerd Limbo, CNM  ?cyclobenzaprine (FLEXERIL) 10 MG tablet Take 1 tablet (10 mg total) by mouth every 8 (eight) hours as needed for muscle spasms. 02/27/21   Bernerd Limbo, CNM  ?docusate sodium (COLACE) 100 MG capsule Take 1 capsule (100 mg total) by mouth 2 (two) times daily. 03/06/21   Bernerd Limbo, CNM  ?escitalopram (LEXAPRO) 20 MG tablet Take 20 mg by mouth daily. 10/28/21   [provider]   ?HYDROcodone-acetaminophen (NORCO/VICODIN) 5-325 MG tablet Take 1 tablet by mouth every 6 (six) hours as needed for up to 5 days for severe pain. 01/29/22 02/03/22 Yes Sherian Maroon A, PA  ?ibuprofen (ADVIL) 600 MG tablet Take 1 tablet (600 mg total) by mouth every 6 (six) hours. ?Patient not taking: Reported on 01/29/2022 03/17/21   Rolm Bookbinder, CNM  ?ondansetron (ZOFRAN) 4 MG tablet Take 1 tablet (4 mg total) by mouth every 6 (six) hours as needed for up to 5 days for nausea or vomiting. 01/29/22 02/03/22 Yes Sherian Maroon A, PA  ?polyethylene glycol (MIRALAX) 17 g packet Take 17 g by mouth daily. 03/06/21   Bernerd Limbo, CNM  ?VYVANSE 30 MG capsule Take 30 mg by mouth daily. 10/28/21   [provider]  ?   ? ?Allergies    ?Patient has no known allergies.   ? ?Review of Systems   ?Review of Systems  ?Constitutional:  Negative for chills, fatigue and fever.  ?HENT:  Negative for congestion, rhinorrhea and sinus pressure.   ?Respiratory:  Negative for apnea, cough, chest tightness, shortness of breath and wheezing.   ?Cardiovascular:  Negative for chest pain, palpitations and leg swelling.  ?Gastrointestinal:  Positive for abdominal pain. Negative for abdominal distention, anal bleeding, blood in stool, constipation, diarrhea, nausea, rectal pain and vomiting.  ?Genitourinary:  Positive for decreased urine volume, difficulty urinating and flank pain. Negative for dyspareunia, dysuria, enuresis, frequency, menstrual problem, pelvic pain, urgency, vaginal bleeding and vaginal pain.  ?Skin:  Negative for color  change and pallor.  ?Neurological:  Negative for dizziness, seizures, syncope, light-headedness and headaches.  ? ?Physical Exam ?Updated Vital Signs ?BP 117/78   Pulse 63   Temp 97.7 ?F (36.5 ?C) (Oral)   Resp 20   LMP 01/21/2022   SpO2 100%  ?Physical Exam ?Vitals and nursing note reviewed.  ?Constitutional:   ?   General: She is in acute distress.  ?   Appearance: She is well-developed and  normal weight.  ?   Comments: Patient expresses persistent pain that isn't subsiding  ?Cardiovascular:  ?   Rate and Rhythm: Normal rate and regular rhythm.  ?   Heart sounds:  ?  No friction rub. No gallop.  ?   Comments: Possible S2 split ?Pulmonary:  ?   Effort: Pulmonary effort is normal. No respiratory distress.  ?   Breath sounds: Normal breath sounds. No stridor. No wheezing, rhonchi or rales.  ?Abdominal:  ?   General: Abdomen is flat. There is no distension. There are no signs of injury.  ?   Palpations: Abdomen is soft. There is no shifting dullness, hepatomegaly, mass or pulsatile mass.  ?   Tenderness: There is abdominal tenderness in the left lower quadrant. There is no right CVA tenderness, left CVA tenderness, guarding or rebound. Negative signs include Murphy's sign and McBurney's sign.  ?   Hernia: No hernia is present.  ?Skin: ?   General: Skin is warm and dry.  ?Neurological:  ?   General: No focal deficit present.  ?   Mental Status: She is alert and oriented to person, place, and time.  ?Psychiatric:     ?   Mood and Affect: Mood normal.     ?   Behavior: Behavior normal. Behavior is cooperative.  ? ? ?ED Results / Procedures / Treatments   ?Labs ?(all labs ordered are listed, but only abnormal results are displayed) ?Labs Reviewed  ?COMPREHENSIVE METABOLIC PANEL - Abnormal; Notable for the following components:  ?    Result Value  ? Potassium 3.1 (*)   ? CO2 21 (*)   ? Glucose, Bld 146 (*)   ? Alkaline Phosphatase 37 (*)   ? All other components within normal limits  ?CBC - Abnormal; Notable for the following components:  ? WBC 3.1 (*)   ? All other components within normal limits  ?URINALYSIS, ROUTINE W REFLEX MICROSCOPIC - Abnormal; Notable for the following components:  ? APPearance HAZY (*)   ? Specific Gravity, Urine >1.030 (*)   ? Hgb urine dipstick LARGE (*)   ? Bilirubin Urine MODERATE (*)   ? Protein, ur 30 (*)   ? All other components within normal limits  ?URINALYSIS, MICROSCOPIC  (REFLEX) - Abnormal; Notable for the following components:  ? Bacteria, UA MANY (*)   ? All other components within normal limits  ?LIPASE, BLOOD  ?I-STAT BETA HCG BLOOD, ED ([facility], [facility], [facility] ONLY)  ? ? ?EKG ?None ? ?Radiology ?CT ABDOMEN PELVIS W CONTRAST ? ?Result Date: 01/29/2022 ?CLINICAL DATA:  Left lower quadrant abdominal pain. EXAM: CT ABDOMEN AND PELVIS WITH CONTRAST TECHNIQUE: Multidetector CT imaging of the abdomen and pelvis was performed using the standard protocol following bolus administration of intravenous contrast. RADIATION DOSE REDUCTION: This exam was performed according to the departmental dose-optimization program which includes automated exposure control, adjustment of the mA and/or kV according to patient size and/or use of iterative reconstruction technique. CONTRAST:  OMNIPAQUE IOHEXOL 300 MG/ML  SOLN COMPARISON:  None. FINDINGS: Lower  chest: No acute abnormality. Hepatobiliary: No gallstones or biliary dilatation is noted. 4.3 cm rounded low density is noted peripherally in right hepatic lobe with peripheral nodular enhancement most consistent with hemangioma. There also appears to be periportal edema in the left hepatic lobe. Pancreas: Unremarkable. No pancreatic ductal dilatation or surrounding inflammatory changes. Spleen: Normal in size without focal abnormality. Adrenals/Urinary Tract: Adrenal glands appear normal. Mild left hydroureteronephrosis is noted secondary to 2 mm calculus at the left ureterovesical junction. Right kidney and ureter are unremarkable. Stomach/Bowel: Stomach is within normal limits. Appendix appears normal. No evidence of bowel wall thickening, distention, or inflammatory changes. Vascular/Lymphatic: No significant vascular findings are present. No enlarged abdominal or pelvic lymph nodes. Reproductive: Uterus and bilateral adnexa are unremarkable. Other: No abdominal wall hernia or abnormality. No abdominopelvic ascites. Musculoskeletal: No acute or significant  osseous findings. IMPRESSION: Mild left hydroureteronephrosis is noted secondary to 2 mm calculus at the left ureterovesical junction. 4.3 cm rounded low density with peripheral nodular enhancement in right hepatic l

## 2022-01-29 NOTE — Discharge Instructions (Addendum)
Follow up with urology if symptoms persist/worsen. Pain medication was prescribed for 5 days; take as needed. Worrisome signs and symptoms were discussed, and the patient expressed understanding to return if noticed.  ?

## 2022-01-29 NOTE — Telephone Encounter (Signed)
6:57 PM  I was asked by social work in order to send prescription to pharmacy.  Patient got hydrocodone prescribed to her pharmacy however they are out of this medication. ?New medication has [facility] placed [facility] [address] as per patient's request. ? ?Vicodin Q4.  ? ? ?Portions of this note were generated with Lobbyist. Dictation errors may occur despite best attempts at proofreading. \ ? ? ? ? ? ? ? ?

## 2022-01-29 NOTE — ED Triage Notes (Signed)
Pt. Stated at triage loudly pain in left side that started this morning. ?

## 2022-01-29 NOTE — Telephone Encounter (Signed)
Patient will continue pharmacy for prescription as her [facility] is currently [facility] of [facility].  This was sent for patient at [facility] request.  ? ? ?Portions of this note were generated with Lobbyist. Dictation errors may occur despite best attempts at proofreading.   ?

## 2022-03-11 IMAGING — US US OB COMP LESS 14 WK
1 series · 15 of 25 positions shown · non-contrast
Comparison: None.

CLINICAL DATA: Abdominal pain and nausea and vomiting. Unsure of
LMP

EXAM:
OBSTETRIC <14 WK ULTRASOUND
TECHNIQUE: Transabdominal ultrasound was performed for evaluation of the
gestation as well as the maternal uterus and adnexal regions.

[Series 1: us ob comp less 14 wk · 25 acquisitions, 15 frames shown]
[im 1/25]
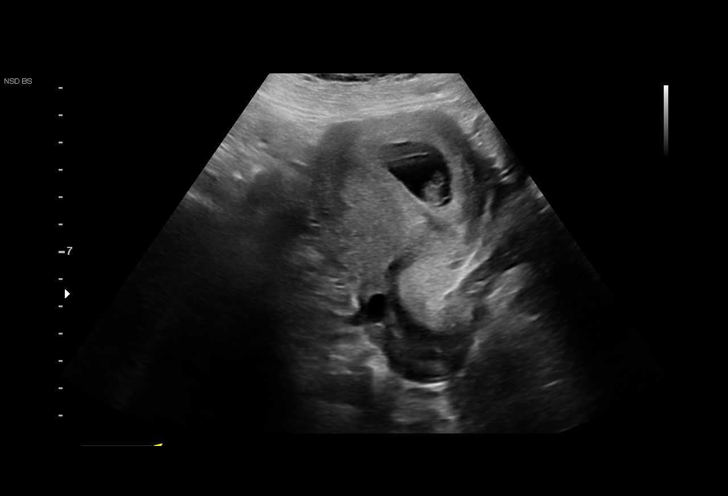
[im 3/25]
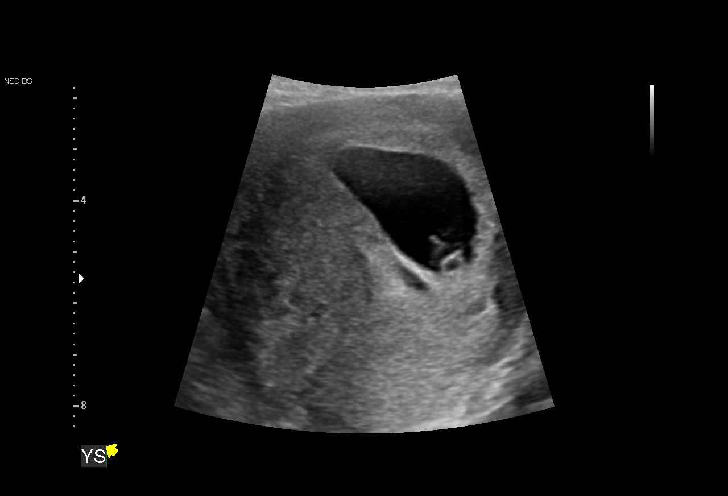
[im 5/25]
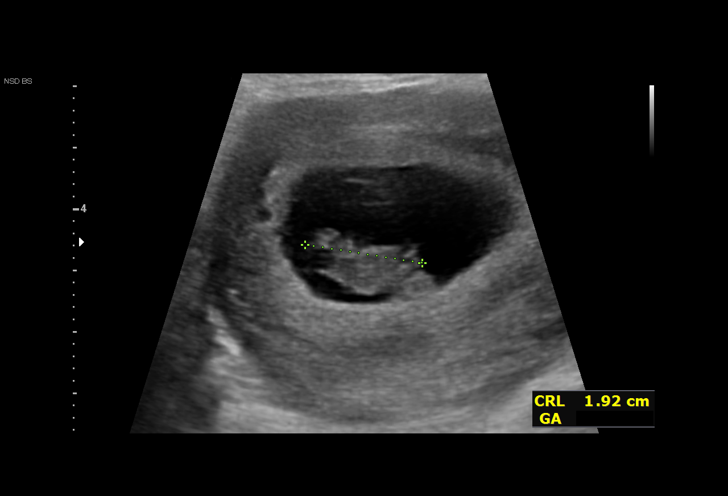
[im 6/25]
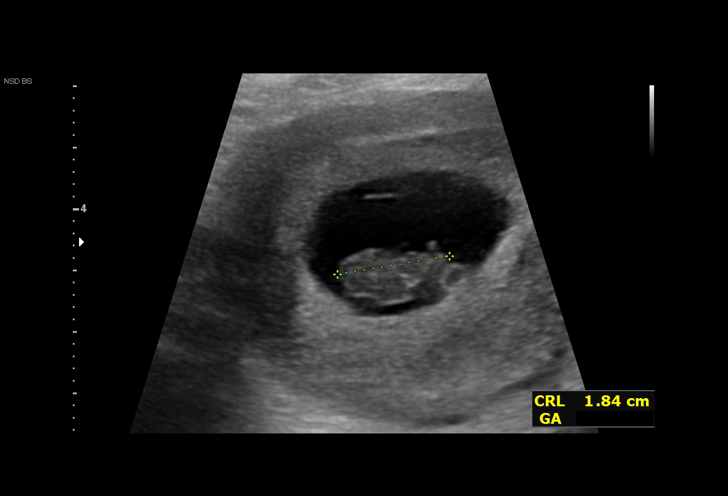
[im 8/25]
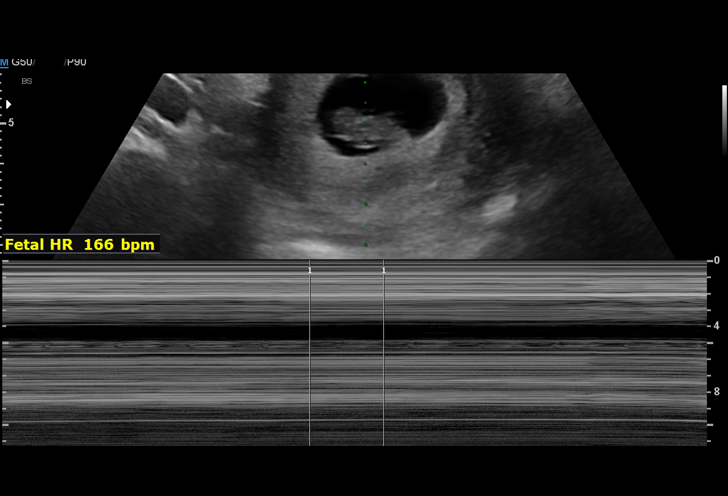
[im 10/25]
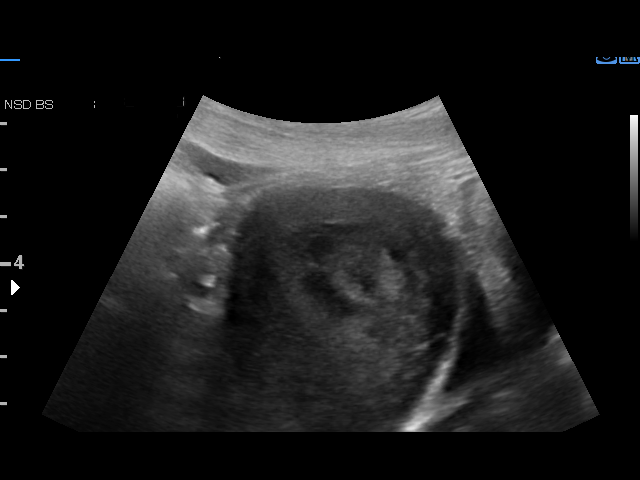
[im 11/25]
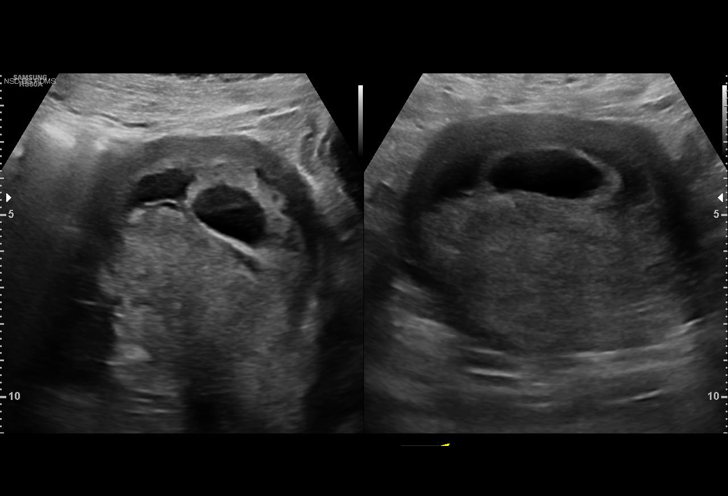
[im 13/25]
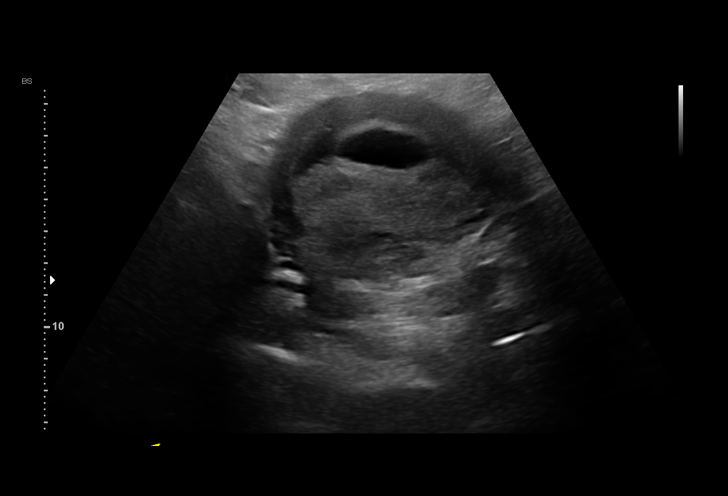
[im 15/25]
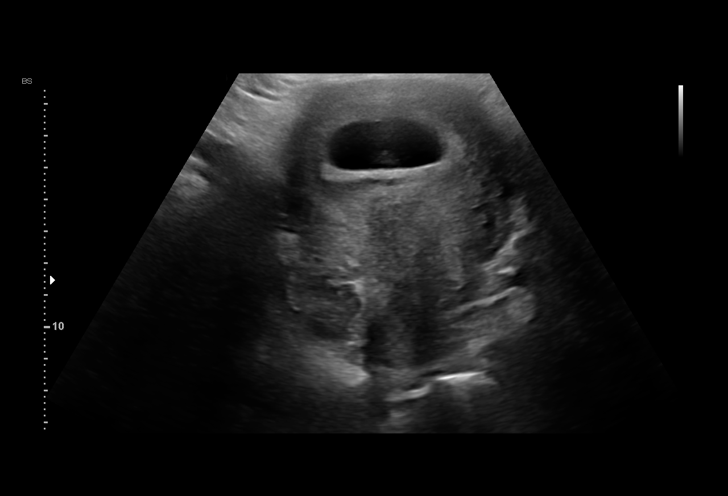
[im 16/25]
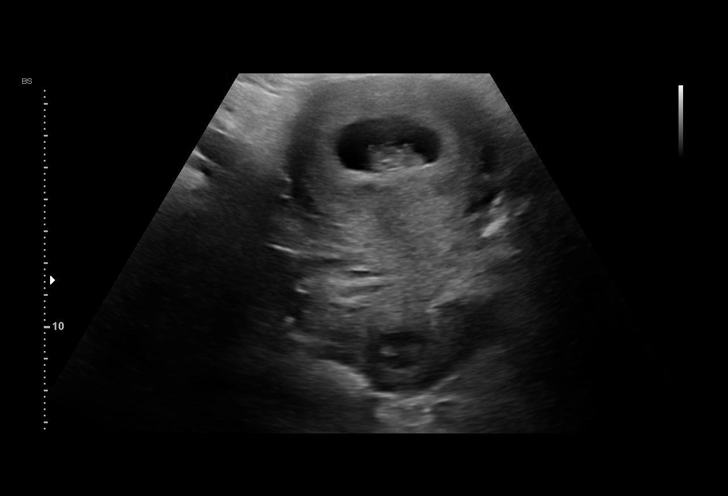
[im 18/25]
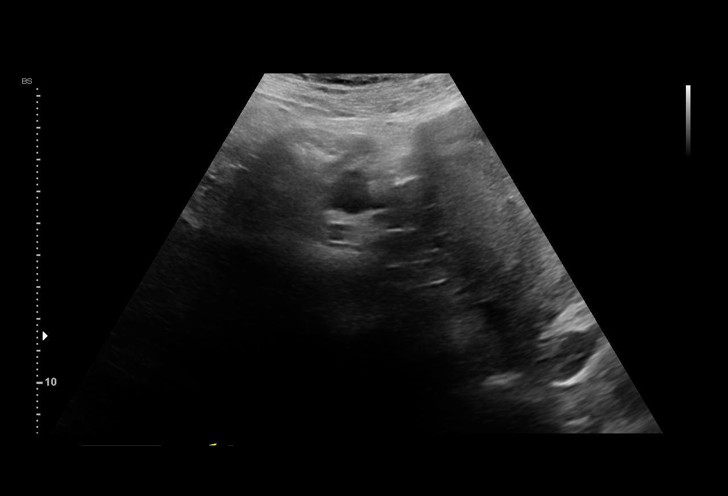
[im 20/25]
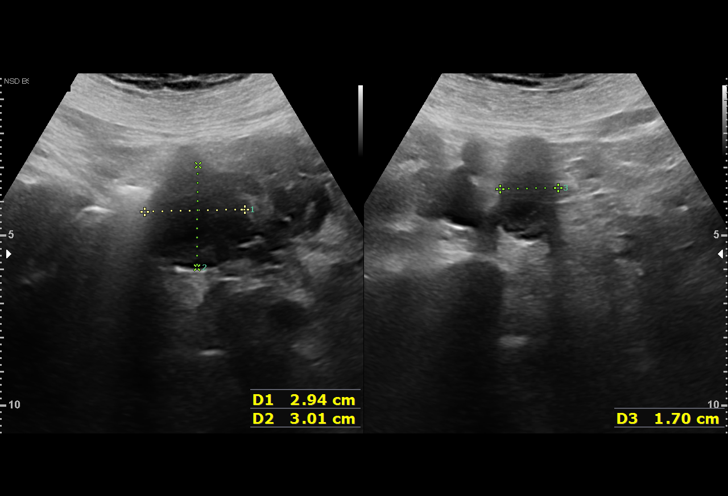
[im 21/25]
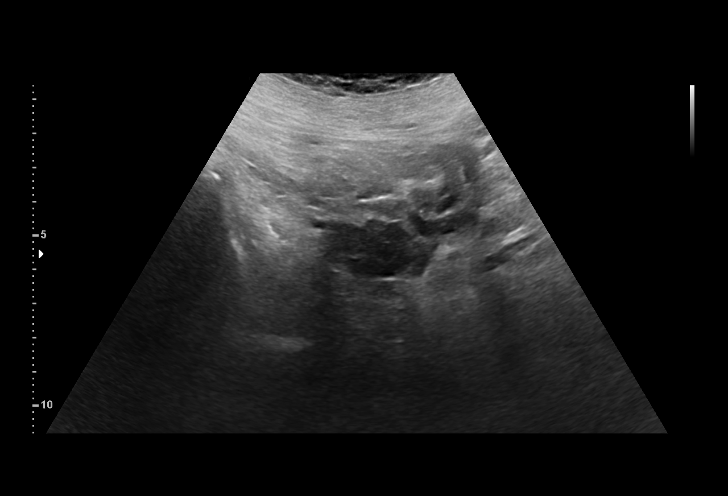
[im 23/25]
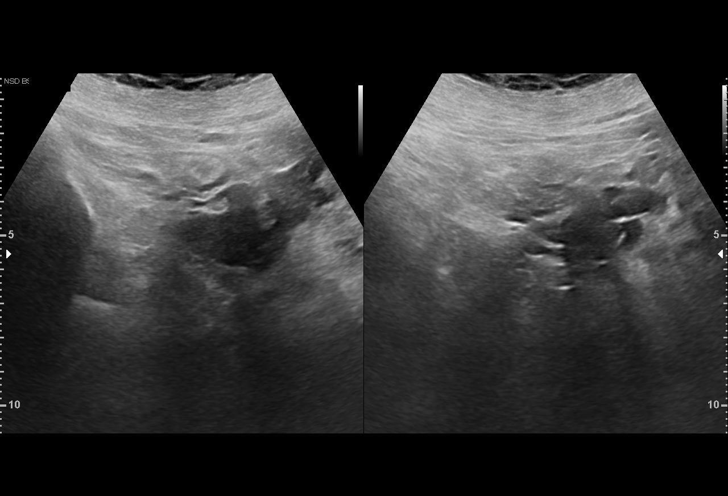
[im 25/25]
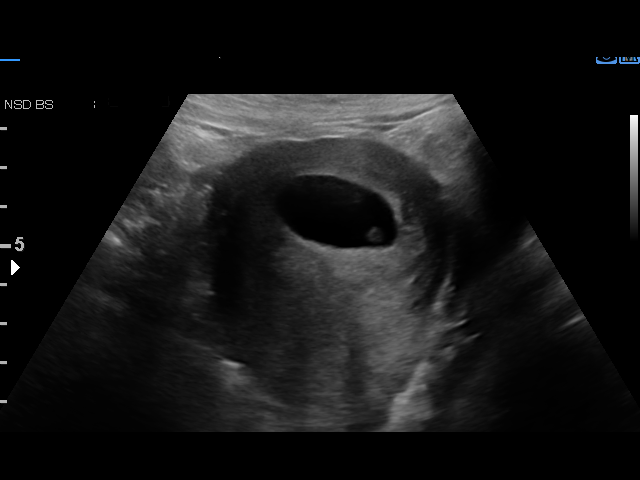

[15 of 25 positions shown; findings below may reference images not displayed]

FINDINGS: Intrauterine gestational sac: Single

Yolk sac:  Visualized.

Embryo:  Visualized.

Cardiac Activity: Visualized.

Heart Rate: 166 bpm

CRL:   19 mm   8 w 3 d                  US EDC: 03/14/2021

Subchorionic hemorrhage: Small to moderate subchorionic hemorrhage
noted.

Maternal uterus/adnexae: Both ovaries are normal in appearance. No
adnexal mass or free fluid identified.
IMPRESSION: Single living IUP with estimated gestational age of 8 weeks 3 days,
and US EDC of 03/14/2021.

Small to moderate subchorionic hemorrhage.

## 2023-09-04 IMAGING — CT CT ABD-PELV W/ CM
2 of 4 series · 16 of 46 positions shown, 18 images · IV contrast (APPLIED)
Comparison: None.

CLINICAL DATA: Left lower quadrant abdominal pain.

EXAM:
CT ABDOMEN AND PELVIS WITH CONTRAST
TECHNIQUE: Multidetector CT imaging of the abdomen and pelvis was performed
using the standard protocol following bolus administration of
intravenous contrast.

[Series 3: abd/ pelvis 5.0 i30f 2 · axial · 0.98mm/px · z∈[+790,+1215]mm · 13 of 93 slices shown, 15 images]
[im 4/93  soft-tissue]
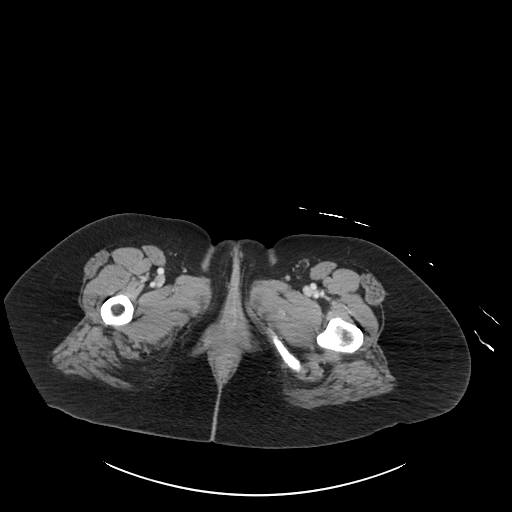
[im 4/93  bone]
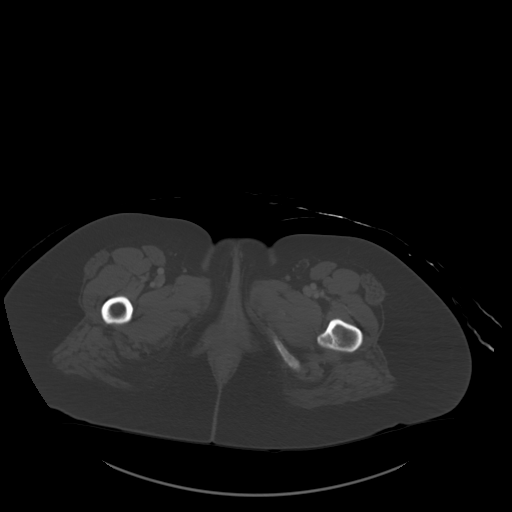
[im 12/93  soft-tissue]
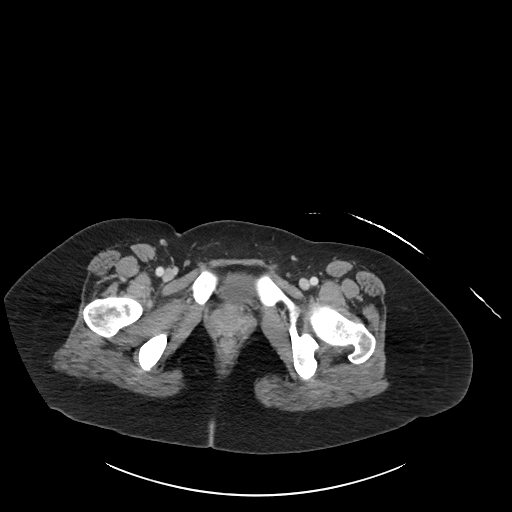
[im 20/93  soft-tissue]
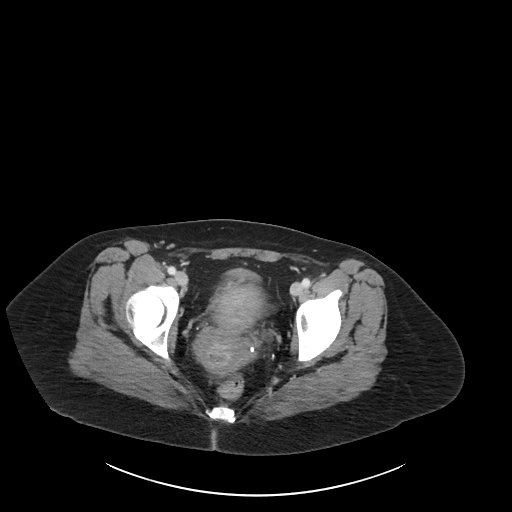
[im 27/93  soft-tissue]
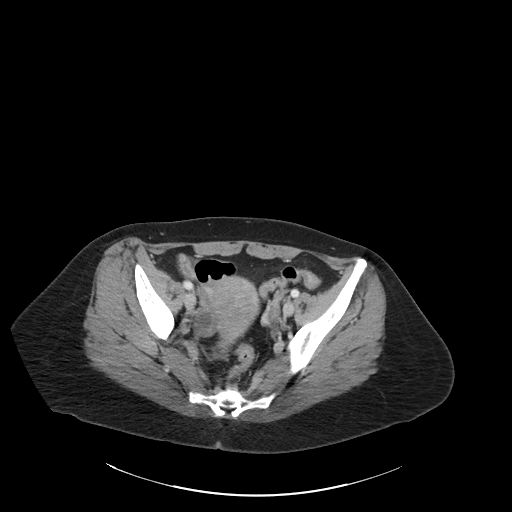
[im 31/93  soft-tissue]
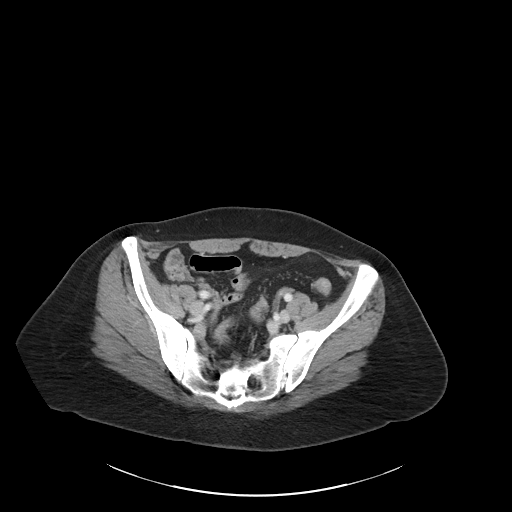
[im 39/93  soft-tissue]
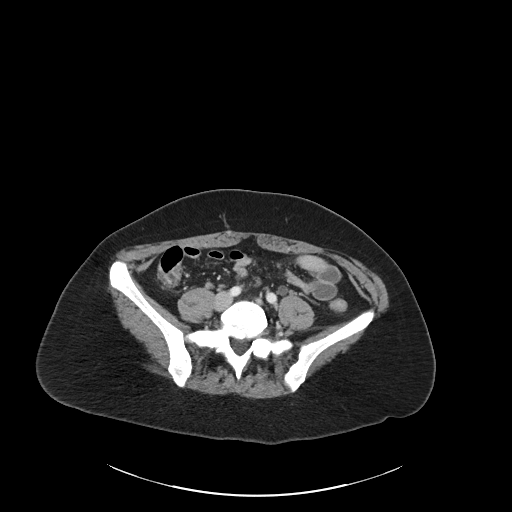
[im 47/93  soft-tissue]
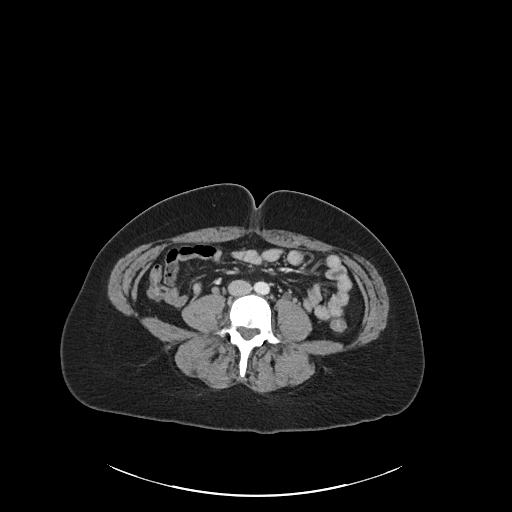
[im 54/93  soft-tissue]
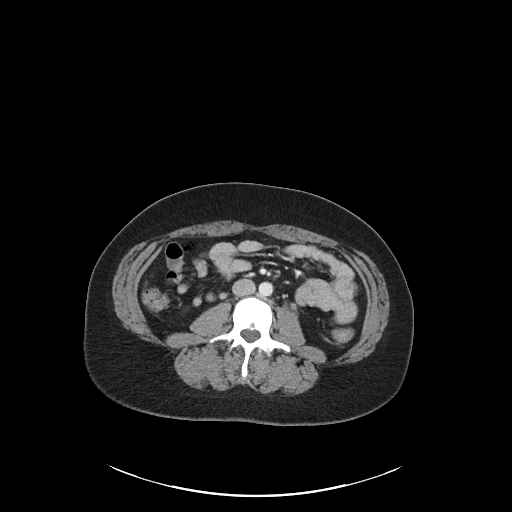
[im 62/93  soft-tissue]
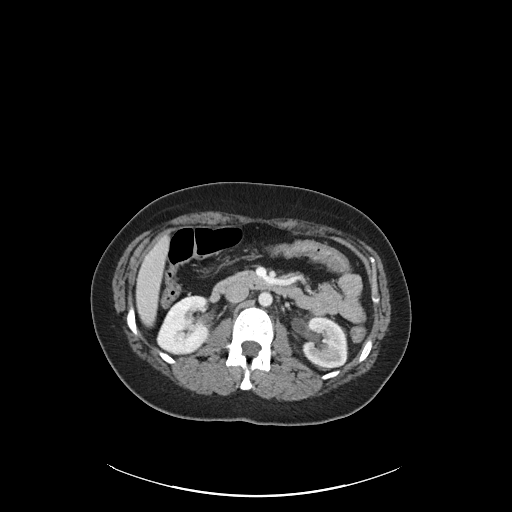
[im 62/93  bone]
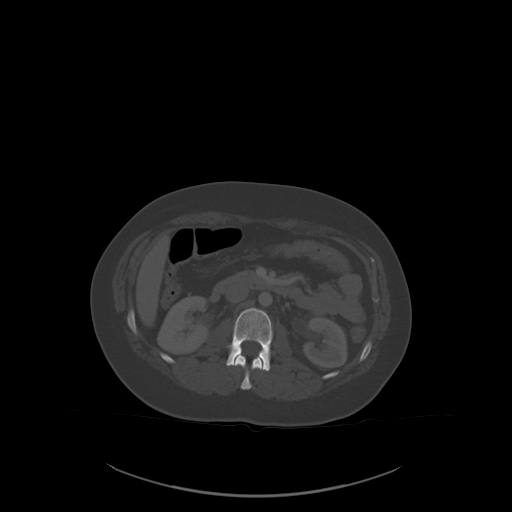
[im 66/93  soft-tissue]
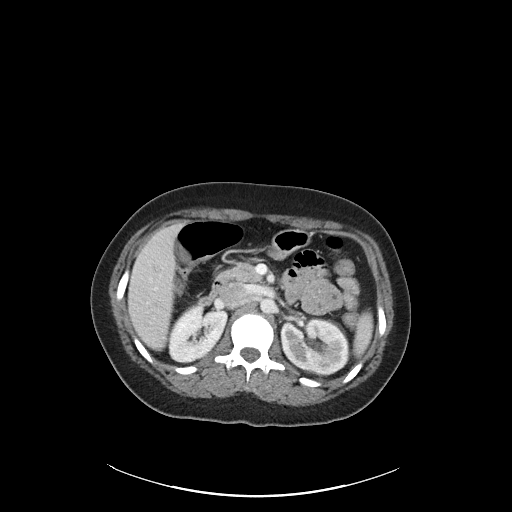
[im 73/93  soft-tissue]
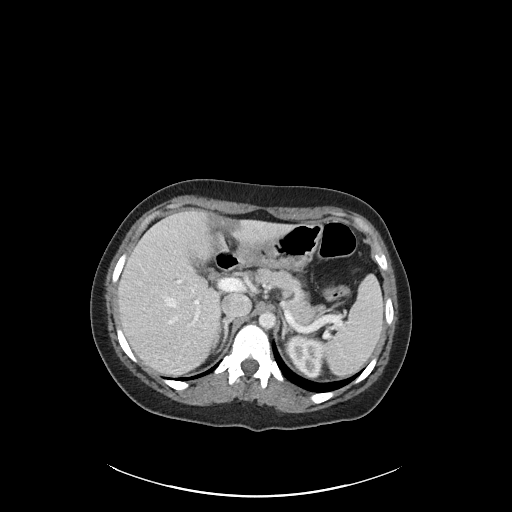
[im 81/93  soft-tissue]
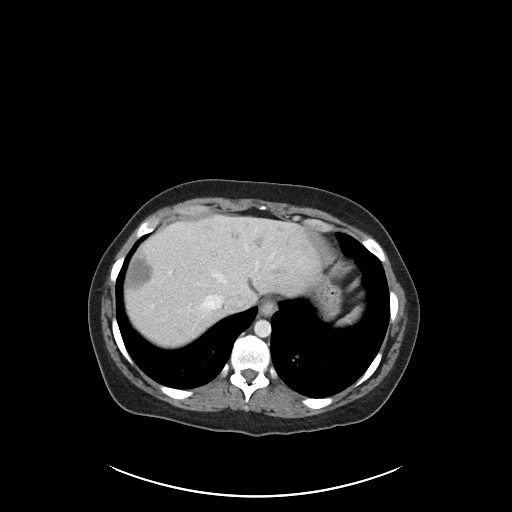
[im 89/93  soft-tissue]
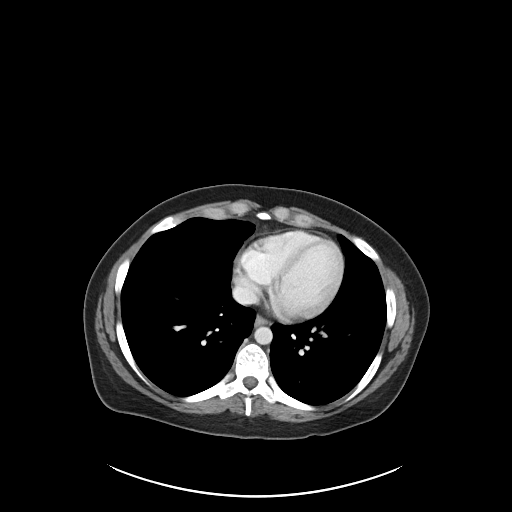

[Series 6: coronal soft tissue · coronal · 0.90mm/px · 3 of 99 slices shown]
[im 33/99  soft-tissue]
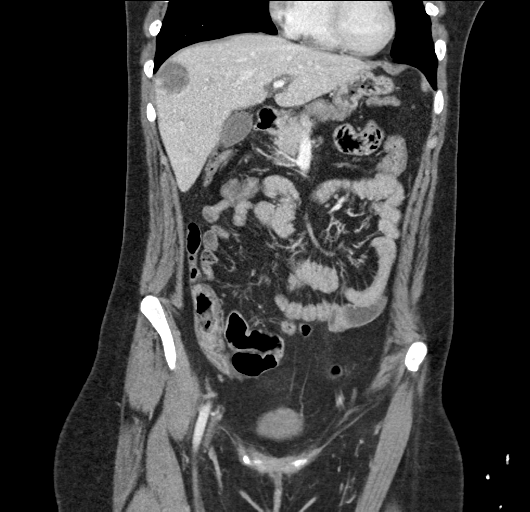
[im 44/99  soft-tissue]
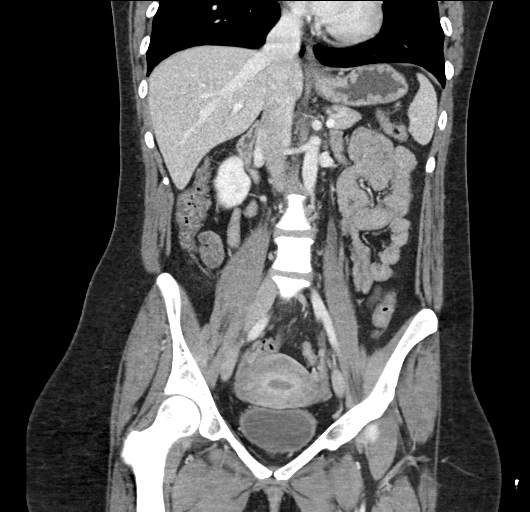
[im 55/99  soft-tissue]
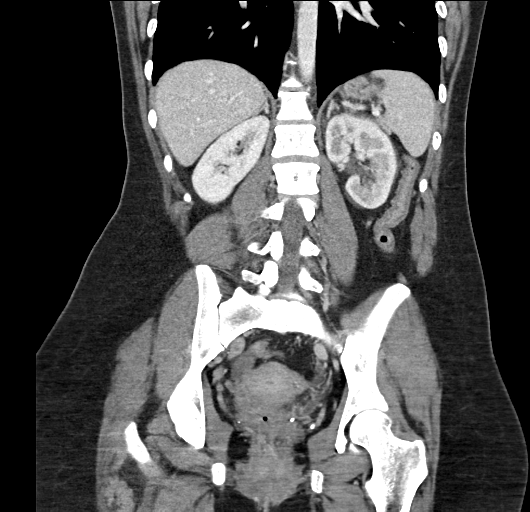

[16 of 46 positions shown; findings below may reference images not displayed]

RADIATION DOSE REDUCTION: This exam was performed according to the
departmental dose-optimization program which includes automated
exposure control, adjustment of the mA and/or kV according to
patient size and/or use of iterative reconstruction technique.

CONTRAST:  100mL OMNIPAQUE IOHEXOL 300 MG/ML  SOLN
FINDINGS: Lower chest: No acute abnormality.

Hepatobiliary: No gallstones or biliary dilatation is noted. 4.3 cm
rounded low density is noted peripherally in right hepatic lobe with
peripheral nodular enhancement most consistent with hemangioma.
There also appears to be periportal edema in the left hepatic lobe.

Pancreas: Unremarkable. No pancreatic ductal dilatation or
surrounding inflammatory changes.

Spleen: Normal in size without focal abnormality.

Adrenals/Urinary Tract: Adrenal glands appear normal. Mild left
hydroureteronephrosis is noted secondary to 2 mm calculus at the
left ureterovesical junction. Right kidney and ureter are
unremarkable.

Stomach/Bowel: Stomach is within normal limits. Appendix appears
normal. No evidence of bowel wall thickening, distention, or
inflammatory changes.

Vascular/Lymphatic: No significant vascular findings are present. No
enlarged abdominal or pelvic lymph nodes.

Reproductive: Uterus and bilateral adnexa are unremarkable.

Other: No abdominal wall hernia or abnormality. No abdominopelvic
ascites.

Musculoskeletal: No acute or significant osseous findings.
IMPRESSION: Mild left hydroureteronephrosis is noted secondary to 2 mm calculus
at the left ureterovesical junction.

4.3 cm rounded low density with peripheral nodular enhancement in
right hepatic lobe most consistent with hemangioma.

Periportal edema is seen in left hepatic lobe of unknown etiology.

## 2024-05-14 ENCOUNTER — Telehealth: Admitting: Family Medicine

## 2024-05-14 DIAGNOSIS — K047 Periapical abscess without sinus: Secondary | ICD-10-CM | POA: Diagnosis not present

## 2024-05-14 MED ORDER — PENICILLIN V POTASSIUM 500 MG PO TABS: 500.0000 mg | ORAL_TABLET | Freq: Three times a day (TID) | ORAL | 0 refills | Status: AC

## 2024-05-14 NOTE — Patient Instructions (Signed)
 Geni Areas, thank you for joining Roosvelt Mater, PA-C for today's virtual visit.  While this provider is not your primary care provider (PCP), if your PCP is located in our provider database this encounter information will be shared with them immediately following your visit.   A [facility] MyChart account gives you access to today's visit and all your visits, tests, and labs performed at [facility]  click here if you don't have a [facility] MyChart account or go to [email or web address]  Consent: (Patient) Jordan Fowler provided verbal consent for this virtual visit at the beginning of the encounter.  Current Medications:  Current Outpatient Medications:    penicillin  v potassium (VEETID) 500 MG tablet, Take 1 tablet (500 mg total) by mouth 3 (three) times daily for 7 days., Disp: 21 tablet, Rfl: 0   acetaminophen  (TYLENOL ) 500 MG tablet, Take 2 tablets (1,000 mg total) by mouth every 8 (eight) hours., Disp: 30 tablet, Rfl: 0   buPROPion  (WELLBUTRIN  XL) 150 MG 24 hr tablet, Take 1 tablet (150 mg total) by mouth daily., Disp: 30 tablet, Rfl: 2   cyclobenzaprine  (FLEXERIL ) 10 MG tablet, Take 1 tablet (10 mg total) by mouth every 8 (eight) hours as needed for muscle spasms., Disp: 30 tablet, Rfl: 1   docusate sodium  (COLACE) 100 MG capsule, Take 1 capsule (100 mg total) by mouth 2 (two) times daily., Disp: 60 capsule, Rfl: 0   escitalopram (LEXAPRO) 20 MG tablet, Take 20 mg by mouth daily., Disp: , Rfl:    ibuprofen  (ADVIL ) 600 MG tablet, Take 1 tablet (600 mg total) by mouth every 6 (six) hours. (Patient not taking: Reported on 01/29/2022), Disp: 30 tablet, Rfl: 0   polyethylene glycol (MIRALAX ) 17 g packet, Take 17 g by mouth daily., Disp: 14 each, Rfl: 0   VYVANSE 30 MG capsule, Take 30 mg by mouth daily., Disp: , Rfl:    Medications ordered in this encounter:  Meds ordered this encounter  Medications   penicillin  v potassium (VEETID) 500 MG tablet    Sig: Take 1 tablet  (500 mg total) by mouth 3 (three) times daily for 7 days.    Dispense:  21 tablet    Refill:  0     *If you need refills on other medications prior to your next appointment, please contact your pharmacy*  Follow-Up: Call back or seek an in-person evaluation if the symptoms worsen or if the condition fails to improve as anticipated.  [facility] [phone]  Other Instructions Dental Abscess  A dental abscess is an area of pus in or around a tooth. It comes from an infection. It can cause pain and other symptoms. Treatment will help with symptoms and prevent the infection from spreading. What are the causes? This condition is caused by an infection in or around the tooth. This can be from: Very bad tooth decay (cavities). A bad injury to the tooth, such as a broken or chipped tooth. What increases the risk? The risk to get an abscess is higher in males. It is also more likely in people who: Have dental decay. Have very bad gum disease. Eat sugary snacks between meals. Use tobacco. Have diabetes. Have a weak disease-fighting system (immune system). Do not brush their teeth regularly. What are the signs or symptoms? Some mild symptoms are: Tenderness. Bad breath. Fever. A sharp, sour taste in the mouth. Pain in and around the infected tooth. Worse symptoms of this condition include: Swollen neck glands. Chills. Pus  draining around the tooth. Swelling and redness around the tooth, the mouth, or the face. Very bad pain in and around the tooth. The worst symptoms can include: Difficulty swallowing. Difficulty opening your mouth. Feeling like you may vomit or vomiting. How is this treated? This is treated by getting rid of the infection. Your dentist will discuss ways to do this, including: Antibiotic medicines. Antibacterial mouth rinse. An incision in the abscess to drain out the pus. A root canal. Removing the tooth. Follow these instructions at  home: Medicines Take over-the-counter and prescription medicines only as told by your dentist. If you were prescribed an antibiotic medicine, take it as told by your dentist. Do not stop taking it even if you start to feel better. If you were prescribed a gel that has numbing medicine in it, use it exactly as told. Ask your dentist if you should avoid driving or using machines while you are taking your medicine. General instructions Rinse your mouth often with salt water. To make salt water, dissolve -1 tsp (3-6 g) of salt in 1 cup (237 mL) of warm water. Eat a soft diet while your mouth is healing. Drink enough fluid to keep your pee (urine) pale yellow. Do not apply heat to the outside of your mouth. Do not smoke or use any products that contain nicotine  or tobacco. If you need help quitting, ask your dentist. Keep all follow-up visits. Prevent an abscess Brush your teeth every morning and every night. Use fluoride toothpaste. Floss your teeth each day. Get dental cleanings as often as told by your dentist. Think about getting dental sealant put on teeth that have deep holes (decay). Drink water that has fluoride in it. Most tap water has fluoride. Check the label on bottled water to see if it has fluoride in it. Drink water instead of sugary drinks. Eat healthy meals and snacks. Wear a mouth guard or face shield when you play sports. Contact a doctor if: Your pain is worse and medicine does not help. Get help right away if: You have a fever or chills. Your symptoms suddenly get worse. You have a very bad headache. You have problems breathing or swallowing. You have trouble opening your mouth. You have swelling in your neck or close to your eye. These symptoms may be an emergency. Get help right away. Call your local emergency services (911 in the [place] Do not wait to see if the symptoms will go away. Do not drive yourself to the hospital. Summary A dental abscess is an area  of pus in or around a tooth. It is caused by an infection. Treatment will help with symptoms and prevent the infection from spreading. Take over-the-counter and prescription medicines only as told by your dentist. To prevent an abscess, take good care of your teeth. Brush your teeth every morning and night. Use floss every day. Get dental cleanings as often as told by your dentist. This information is not intended to replace advice given to you by your health care provider. Make sure you discuss any questions you have with your health care provider. Document Revised: 12/19/2020 Document Reviewed: 12/20/2020 Elsevier Patient Education  2024 [employer]   If you have been instructed to have an in-person evaluation today at a local Urgent Care facility, please use the link below. It will take you to a list of all of our available [facility], including address, phone number and hours of operation. Please do not delay care.  [facility]  If you or a family member do not have a primary care provider, use the link below to schedule a visit and establish care. When you choose a [facility] primary care physician or advanced practice provider, you gain a long-term partner in health. Find a Primary Care Provider  Learn more about [facility] in-office and virtual care options: [facility] - Get Care Now

## 2024-05-14 NOTE — Progress Notes (Signed)
 Virtual Visit Consent   Jordan Fowler, you are scheduled for a virtual visit with a [facility] provider today. Just as with appointments in the office, your consent must be obtained to participate. Your consent will be active for this visit and any virtual visit you may have with one of our providers in the next 365 days. If you have a MyChart account, a copy of this consent can be sent to you electronically.  As this is a virtual visit, video technology does not allow for your provider to perform a traditional examination. This may limit your provider's ability to fully assess your condition. If your provider identifies any concerns that need to be evaluated in person or the need to arrange testing (such as labs, EKG, etc.), we will make arrangements to do so. Although advances in technology are sophisticated, we cannot ensure that it will always work on either your end or our end. If the connection with a video visit is poor, the visit may have to be switched to a telephone visit. With either a video or telephone visit, we are not always able to ensure that we have a secure connection.  By engaging in this virtual visit, you consent to the provision of healthcare and authorize for your insurance to be billed (if applicable) for the services provided during this visit. Depending on your insurance coverage, you may receive a charge related to this service.  I need to obtain your verbal consent now. Are you willing to proceed with your visit today? Jordan Fowler has provided verbal consent on 05/14/2024 for a virtual visit (video or telephone). Jordan Fowler, [place]  Date: 05/14/2024 2:15 PM   Virtual Visit via Video Note   I, Jordan Fowler, connected with  Jordan Fowler  ([id number], 06-13-1991) on 05/14/24 at  2:00 PM EDT by a video-enabled telemedicine application and verified that I am speaking with the correct person using two identifiers.  Location: Patient: Virtual Visit Location Patient:  Home Provider: Virtual Visit Location Provider: Home Office   I discussed the limitations of evaluation and management by telemedicine and the availability of in person appointments. The patient expressed understanding and agreed to proceed.    History of Present Illness: Jordan Fowler is a 33 y.o. who identifies as a female who was assigned female at birth, and is being seen today for c/o pretty sure she has an infection in her molar.  Pt c/o left side of face being swollen. Pt states symptoms started two days ago with tooth pain.  Pt states this morning her face was swollen and face feeling hard.   HPI: HPI  Problems:  Patient Active Problem List   Diagnosis Date Noted   Indication for care in labor or delivery 03/16/2021   Post term pregnancy over 40 weeks 03/16/2021   Adult ADHD 02/06/2021   Need for prophylactic vaccination with measles-mumps-rubella (MMR) vaccine 11/17/2020   Gastroesophageal reflux disease 11/14/2020   Supervision of low-risk pregnancy, second trimester 11/14/2020    Allergies: No Known Allergies Medications:  Current Outpatient Medications:    penicillin  v potassium (VEETID) 500 MG tablet, Take 1 tablet (500 mg total) by mouth 3 (three) times daily for 7 days., Disp: 21 tablet, Rfl: 0   acetaminophen  (TYLENOL ) 500 MG tablet, Take 2 tablets (1,000 mg total) by mouth every 8 (eight) hours., Disp: 30 tablet, Rfl: 0   buPROPion  (WELLBUTRIN  XL) 150 MG 24 hr tablet, Take 1 tablet (150 mg total) by mouth daily., Disp: 30 tablet, Rfl:  2   cyclobenzaprine  (FLEXERIL ) 10 MG tablet, Take 1 tablet (10 mg total) by mouth every 8 (eight) hours as needed for muscle spasms., Disp: 30 tablet, Rfl: 1   docusate sodium  (COLACE) 100 MG capsule, Take 1 capsule (100 mg total) by mouth 2 (two) times daily., Disp: 60 capsule, Rfl: 0   escitalopram (LEXAPRO) 20 MG tablet, Take 20 mg by mouth daily., Disp: , Rfl:    ibuprofen  (ADVIL ) 600 MG tablet, Take 1 tablet (600 mg total) by mouth every  6 (six) hours. (Patient not taking: Reported on 01/29/2022), Disp: 30 tablet, Rfl: 0   polyethylene glycol (MIRALAX ) 17 g packet, Take 17 g by mouth daily., Disp: 14 each, Rfl: 0   VYVANSE 30 MG capsule, Take 30 mg by mouth daily., Disp: , Rfl:   Observations/Objective: Patient is well-developed, well-nourished in no acute distress.  Resting comfortably at home.  Head is normocephalic, atraumatic.  No labored breathing.  Speech is clear and coherent with logical content.  Patient is alert and oriented at baseline.    Assessment and Plan: 1. Tooth abscess (Primary) - penicillin  v potassium (VEETID) 500 MG tablet; Take 1 tablet (500 mg total) by mouth 3 (three) times daily for 7 days.  Dispense: 21 tablet; Refill: 0  -Start Penicillin   -Pt to F/U with dental as soon as possible for further evaluation.  Follow Up Instructions: I discussed the assessment and treatment plan with the patient. The patient was provided an opportunity to ask questions and all were answered. The patient agreed with the plan and demonstrated an understanding of the instructions.  A copy of instructions were sent to the patient via MyChart unless otherwise noted below.    The patient was advised to call back or seek an in-person evaluation if the symptoms worsen or if the condition fails to improve as anticipated.    Jordan Mater, PA-C
